# Patient Record
Sex: Female | Born: 1975 | Hispanic: Yes | Marital: Single | Smoking: Never smoker
Health system: Southern US, Community
[De-identification: ages and names within clinical notes are randomized; demographics above are authoritative.]

## PROBLEM LIST (undated history)

## (undated) ENCOUNTER — Inpatient Hospital Stay (HOSPITAL_COMMUNITY): Payer: Self-pay

## (undated) DIAGNOSIS — F1020 Alcohol dependence, uncomplicated: Secondary | ICD-10-CM

## (undated) DIAGNOSIS — F1010 Alcohol abuse, uncomplicated: Secondary | ICD-10-CM

## (undated) DIAGNOSIS — IMO0001 Reserved for inherently not codable concepts without codable children: Secondary | ICD-10-CM

## (undated) DIAGNOSIS — F32A Depression, unspecified: Secondary | ICD-10-CM

## (undated) DIAGNOSIS — F329 Major depressive disorder, single episode, unspecified: Secondary | ICD-10-CM

## (undated) HISTORY — PX: NO PAST SURGERIES: SHX2092

---

## 2000-02-21 ENCOUNTER — Emergency Department (HOSPITAL_COMMUNITY): Admission: EM | Admit: 2000-02-21 | Discharge: 2000-02-21 | Payer: Self-pay | Admitting: Emergency Medicine

## 2000-03-04 ENCOUNTER — Emergency Department (HOSPITAL_COMMUNITY): Admission: EM | Admit: 2000-03-04 | Discharge: 2000-03-04 | Payer: Self-pay | Admitting: Emergency Medicine

## 2000-10-10 ENCOUNTER — Inpatient Hospital Stay (HOSPITAL_COMMUNITY): Admission: AD | Admit: 2000-10-10 | Discharge: 2000-10-12 | Payer: Self-pay | Admitting: Obstetrics & Gynecology

## 2004-09-10 ENCOUNTER — Inpatient Hospital Stay (HOSPITAL_COMMUNITY): Admission: AD | Admit: 2004-09-10 | Discharge: 2004-09-12 | Payer: Self-pay | Admitting: Obstetrics and Gynecology

## 2006-11-20 ENCOUNTER — Inpatient Hospital Stay (HOSPITAL_COMMUNITY): Admission: RE | Admit: 2006-11-20 | Discharge: 2006-11-22 | Payer: Self-pay | Admitting: Obstetrics

## 2009-01-25 ENCOUNTER — Inpatient Hospital Stay (HOSPITAL_COMMUNITY): Admission: AD | Admit: 2009-01-25 | Discharge: 2009-01-27 | Payer: Self-pay | Admitting: Obstetrics

## 2009-01-25 ENCOUNTER — Encounter (INDEPENDENT_AMBULATORY_CARE_PROVIDER_SITE_OTHER): Payer: Self-pay | Admitting: Obstetrics

## 2010-09-16 LAB — CBC
MCHC: 34.6 g/dL (ref 30.0–36.0)
Platelets: 176 10*3/uL (ref 150–400)
Platelets: 198 10*3/uL (ref 150–400)
RDW: 14.5 % (ref 11.5–15.5)
WBC: 11.6 10*3/uL — ABNORMAL HIGH (ref 4.0–10.5)

## 2010-09-16 LAB — RPR: RPR Ser Ql: NONREACTIVE

## 2011-03-29 LAB — CBC
HCT: 34.9 — ABNORMAL LOW
Hemoglobin: 11 — ABNORMAL LOW
Hemoglobin: 11.9 — ABNORMAL LOW
MCHC: 33.6
MCV: 90
Platelets: 209
RBC: 3.62 — ABNORMAL LOW
WBC: 10.6 — ABNORMAL HIGH
WBC: 8.1

## 2013-04-17 ENCOUNTER — Encounter (HOSPITAL_COMMUNITY): Payer: Self-pay | Admitting: Emergency Medicine

## 2013-04-17 ENCOUNTER — Inpatient Hospital Stay (HOSPITAL_COMMUNITY)
Admission: AD | Admit: 2013-04-17 | Discharge: 2013-04-20 | DRG: 781 | Disposition: A | Discharge status: Home / Self Care | Payer: Self-pay | Source: Ambulatory Visit | Attending: Obstetrics | Admitting: Obstetrics

## 2013-04-17 DIAGNOSIS — F1010 Alcohol abuse, uncomplicated: Secondary | ICD-10-CM | POA: Diagnosis present

## 2013-04-17 DIAGNOSIS — F4323 Adjustment disorder with mixed anxiety and depressed mood: Secondary | ICD-10-CM | POA: Diagnosis present

## 2013-04-17 DIAGNOSIS — F1020 Alcohol dependence, uncomplicated: Secondary | ICD-10-CM | POA: Diagnosis present

## 2013-04-17 DIAGNOSIS — Z331 Pregnant state, incidental: Secondary | ICD-10-CM

## 2013-04-17 DIAGNOSIS — O99340 Other mental disorders complicating pregnancy, unspecified trimester: Principal | ICD-10-CM | POA: Diagnosis present

## 2013-04-17 HISTORY — DX: Major depressive disorder, single episode, unspecified: F32.9

## 2013-04-17 HISTORY — DX: Depression, unspecified: F32.A

## 2013-04-17 HISTORY — DX: Alcohol abuse, uncomplicated: F10.10

## 2013-04-17 HISTORY — DX: Alcohol dependence, uncomplicated: F10.20

## 2013-04-17 HISTORY — DX: Reserved for inherently not codable concepts without codable children: IMO0001

## 2013-04-17 LAB — BASIC METABOLIC PANEL
BUN: 4 mg/dL — ABNORMAL LOW (ref 6–23)
Calcium: 9.1 mg/dL (ref 8.4–10.5)
Chloride: 99 mEq/L (ref 96–112)
GFR calc Af Amer: >90 mL/min (ref >90)
GFR calc non Af Amer: >90 mL/min (ref >90)
Potassium: 3.4 mEq/L — ABNORMAL LOW (ref 3.5–5.1)
Sodium: 133 mEq/L — ABNORMAL LOW (ref 135–145)

## 2013-04-17 LAB — URINE MICROSCOPIC-ADD ON

## 2013-04-17 LAB — CBC
HCT: 30.3 % — ABNORMAL LOW (ref 36.0–46.0)
Hemoglobin: 10.6 g/dL — ABNORMAL LOW (ref 12.0–15.0)
MCH: 32.3 pg (ref 26.0–34.0)
MCHC: 35 g/dL (ref 30.0–36.0)
MCV: 92.4 fL (ref 78.0–100.0)
Platelets: 213 10*3/uL (ref 150–400)
RBC: 3.28 MIL/uL — ABNORMAL LOW (ref 3.87–5.11)
RDW: 12.7 % (ref 11.5–15.5)
WBC: 8 10*3/uL (ref 4.0–10.5)

## 2013-04-17 LAB — RAPID URINE DRUG SCREEN, HOSP PERFORMED
Amphetamines: NOT DETECTED
Barbiturates: NOT DETECTED
Benzodiazepines: NOT DETECTED
Cocaine: NOT DETECTED
Opiates: NOT DETECTED
Tetrahydrocannabinol: NOT DETECTED

## 2013-04-17 LAB — URINALYSIS, ROUTINE W REFLEX MICROSCOPIC
Bilirubin Urine: NEGATIVE
Glucose, UA: NEGATIVE mg/dL
Hgb urine dipstick: NEGATIVE
Ketones, ur: 40 mg/dL — AB
Nitrite: NEGATIVE
Protein, ur: NEGATIVE mg/dL
Specific Gravity, Urine: 1.008 (ref 1.005–1.030)
Urobilinogen, UA: 0.2 mg/dL (ref 0.0–1.0)
pH: 6.5 (ref 5.0–8.0)

## 2013-04-17 LAB — BASIC METABOLIC PANEL WITH GFR
CO2: 23 meq/L (ref 19–32)
Creatinine, Ser: 0.43 mg/dL — ABNORMAL LOW (ref 0.50–1.10)
Glucose, Bld: 87 mg/dL (ref 70–99)

## 2013-04-17 LAB — ETHANOL: Alcohol, Ethyl (B): <11 mg/dL (ref 0–11)

## 2013-04-17 MED ORDER — ONDANSETRON HCL 4 MG PO TABS: 4.0000 mg | ORAL_TABLET | Freq: Three times a day (TID) | ORAL | Status: DC | PRN

## 2013-04-17 MED ORDER — COMPLETENATE 29-1 MG PO CHEW
1.0000 | CHEWABLE_TABLET | Freq: Every day | ORAL | Status: DC
  Administered 2013-04-18 – 2013-04-20 (×3): 1 via ORAL
  Filled 2013-04-17 (×4): qty 1

## 2013-04-17 MED ORDER — CALCIUM CARBONATE ANTACID 500 MG PO CHEW
1.0000 | CHEWABLE_TABLET | ORAL | Status: DC | PRN
Start: 2013-04-17 — End: 2013-04-20

## 2013-04-17 MED ORDER — DIAZEPAM 5 MG PO TABS
5.0000 mg | ORAL_TABLET | Freq: Four times a day (QID) | ORAL | Status: DC
  Administered 2013-04-18 – 2013-04-20 (×10): 5 mg via ORAL
  Filled 2013-04-17 (×12): qty 1

## 2013-04-17 MED ORDER — ACETAMINOPHEN 325 MG PO TABS: 650.0000 mg | ORAL_TABLET | ORAL | Status: DC | PRN

## 2013-04-17 NOTE — ED Notes (Signed)
Patient reports to ED for ETOH problem, patient reports that she drinks 8-10 beers per day, pt has been drinking since she was 76 or 37 years old. Patient reports recent increase in amount of ETOH per day, possibly due to depression. Pt denies SI, hallucinations, desire to harm self. Pt reports that she sometimes goes 3 days at most without beer, and that she sometimes shakes and feels anxious after 1 day without beer. Patient is currently pregnant for the sixth time and due in January, with recent Hx of abnormal pap smear. OB RN at bedside.

## 2013-04-17 NOTE — BH Assessment (Signed)
BHH Assessment Progress Note Attempted to assess pt @ 1800.  Tele psych machine not working.  Attempted to get machine to work with pt's nurse for 45 min.  Informed EDP Ghim, as well as informed him that [facility] had no beds per Marchelle Folks, and this is the only facility that may take a pregnant pt.  He then asked if this writer could get in touch with psychiatrist on call to discuss possible medication recommendations if pt discharged home, as there may be no residential treatment options for her.  Passed this on to clinicians coming on shift.  See notes in EPIC.  Tele assessment not yet completed.

## 2013-04-17 NOTE — BH Assessment (Signed)
BHH Assessment Progress Note  At 18:30 I spoke to EDP Quita Skye, MD about this pt.  Casimer Lanius, TS, had started a tele-assessment before the equipment failed.  Dr Oletta Lamas is requesting feedback on outcome of Kristen's work.  I called TTS and left message for Kristen.  If it is determined to be in the best interest of the pt I will perform face-to-face assessment.  Doylene Canning, MA  Triage Specialist  04/17/2013 @ 17:01

## 2013-04-17 NOTE — BH Assessment (Addendum)
BHH Assessment Progress Note   Update:  EDP Ghim called back and pt/baby medically cleared.  Tele assessment scheduled for 1700.  Called and spoke with EDP Ghim to get clinicals.  EDP Ghim will call writer back to see the pt for tele assessment if cleared by the [facility] RN.  In the meantime, TTS to research facilities that could possibly take pregnant women for inpatient SA treatment.
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
## 2013-04-17 NOTE — BHH Counselor (Addendum)
Writer spoke with Dr. Nyoka Cowden, on call psy. Dr. Baron Sane is concerned that the mother drinks 8-10 beers daily and requireds medical detox to keep the mother and baby out of distress. Recommendations from Dr. Baron Sane is for the pt to be sent to [facility] for continuous monitoring of the baby and have a psy consult at [facility].  Writer spoke with Dr. Oletta Lamas and informed him of the recommendations from Dr. Baron Sane. Dr. Oletta Lamas stated that he would have to contact OB GYN to see if they would be willing to take the pt. Writer gave Dr. Oletta Lamas the direct number for Dr. Baron Sane.  1915-Writer contacted [facility] to inquire if they had a bed for the pt. Due to no psy staff on the weekend, the pt will not be considered until Monday 04/20/13. Denice Bors, [facility] 04/17/2013 7:06 PM

## 2013-04-17 NOTE — Progress Notes (Signed)
1542 Arrived to evaluate this 37 yo G6P5 with report of ETOH abuse. Patient denies contractions, vaginal bleeding, or LOF and reports frequent fetal movement.  Patient can not clearly state EDC and prenatal record is not scanned in to [facility] at this time.  Patient sees Dr. Francoise Ceo for prenatal care.  Dr. Elsie Stain office is currently closed.  1620 Dr. Tamela Oddi, on call for Dr. Elsie Stain patients, was consulted regarding this patient.  She states she is unable to access the patient's prenatal records.  Notified of FHR and UC monitoring.  Patient OB cleared by Dr. Tamela Oddi.  She states unless the patient is in danger of severe alcohol withdrawal symptoms, EFM does not need to be continued.1630  Dr. Oletta Lamas notified of Dr. Marcia Brash recommendation.

## 2013-04-18 ENCOUNTER — Encounter (HOSPITAL_COMMUNITY): Payer: Self-pay | Admitting: *Deleted

## 2013-04-18 ENCOUNTER — Observation Stay (HOSPITAL_COMMUNITY): Payer: Self-pay

## 2013-04-18 DIAGNOSIS — O99340 Other mental disorders complicating pregnancy, unspecified trimester: Principal | ICD-10-CM

## 2013-04-18 DIAGNOSIS — F1010 Alcohol abuse, uncomplicated: Secondary | ICD-10-CM | POA: Diagnosis present

## 2013-04-18 DIAGNOSIS — F4323 Adjustment disorder with mixed anxiety and depressed mood: Secondary | ICD-10-CM | POA: Diagnosis present

## 2013-04-18 LAB — COMPREHENSIVE METABOLIC PANEL
BUN: 8 mg/dL (ref 6–23)
CO2: 25 mEq/L (ref 19–32)
Chloride: 101 mEq/L (ref 96–112)
Creatinine, Ser: 0.5 mg/dL (ref 0.50–1.10)
GFR calc Af Amer: >90 mL/min (ref >90)
GFR calc non Af Amer: >90 mL/min (ref >90)
Glucose, Bld: 91 mg/dL (ref 70–99)
Total Bilirubin: 0.3 mg/dL (ref 0.3–1.2)

## 2013-04-18 LAB — CBC
HCT: 28.3 % — ABNORMAL LOW (ref 36.0–46.0)
Hemoglobin: 10.1 g/dL — ABNORMAL LOW (ref 12.0–15.0)
MCH: 32 pg (ref 26.0–34.0)
MCHC: 35 g/dL (ref 30.0–36.0)
MCHC: 34.6 g/dL (ref 30.0–36.0)
MCV: 91.9 fL (ref 78.0–100.0)
MCV: 92.4 fL (ref 78.0–100.0)
RDW: 13 % (ref 11.5–15.5)
RDW: 12.9 % (ref 11.5–15.5)
WBC: 6.4 10*3/uL (ref 4.0–10.5)

## 2013-04-18 LAB — DIFFERENTIAL
Basophils Absolute: 0 10*3/uL (ref 0.0–0.1)
Basophils Relative: 0 % (ref 0–1)
Eosinophils Absolute: 0.1 10*3/uL (ref 0.0–0.7)
Eosinophils Relative: 1 % (ref 0–5)
Lymphs Abs: 2.1 10*3/uL (ref 0.7–4.0)
Monocytes Absolute: 0.6 10*3/uL (ref 0.1–1.0)
Neutro Abs: 3.7 10*3/uL (ref 1.7–7.7)

## 2013-04-18 LAB — HEPATITIS B SURFACE ANTIGEN: Hepatitis B Surface Ag: NEGATIVE

## 2013-04-18 LAB — ABO/RH: ABO/RH(D): O POS

## 2013-04-18 LAB — RPR: RPR Ser Ql: NONREACTIVE

## 2013-04-18 MED ORDER — INFLUENZA VAC SPLIT QUAD 0.5 ML IM SUSP
0.5000 mL | INTRAMUSCULAR | Status: AC
Start: 2013-04-19 — End: 2013-04-19
  Administered 2013-04-19: 0.5 mL via INTRAMUSCULAR
  Filled 2013-04-18: qty 0.5

## 2013-04-18 MED ORDER — DOCUSATE SODIUM 100 MG PO CAPS
100.0000 mg | ORAL_CAPSULE | Freq: Every day | ORAL | Status: DC
  Administered 2013-04-18 – 2013-04-20 (×3): 100 mg via ORAL
  Filled 2013-04-18 (×3): qty 1

## 2013-04-18 MED ORDER — SODIUM CHLORIDE 0.9 % IV SOLN: 250.0000 mL | INTRAVENOUS | Status: DC | PRN

## 2013-04-18 MED ORDER — SODIUM CHLORIDE 0.9 % IJ SOLN: 3.0000 mL | INTRAMUSCULAR | Status: DC | PRN

## 2013-04-18 MED ORDER — SODIUM CHLORIDE 0.9 % IJ SOLN
3.0000 mL | Freq: Two times a day (BID) | INTRAMUSCULAR | Status: DC
  Administered 2013-04-18 – 2013-04-19 (×3): 3 mL via INTRAVENOUS

## 2013-04-18 MED ORDER — ZOLPIDEM TARTRATE 5 MG PO TABS: 5.0000 mg | ORAL_TABLET | Freq: Every evening | ORAL | Status: DC | PRN

## 2013-04-18 NOTE — H&P (Signed)
Diane Rodriguez is a 37 y.o. female presenting for possible alcohol detoxification. Maternal Medical History:  Reason for admission: She presented to the [facility] with a h/o chronic alcoholism.  The patient is requesting assistance with detoxification.  Prenatal complications: Chronic alcoholism  Prenatal Complications - Diabetes: none.    OB History   Grav Para Term Preterm Abortions TAB SAB Ect Mult Living   6 5        5      Past Medical History  Diagnosis Date  . Alcohol abuse   . Depression   . Alcoholism /alcohol abuse     since age [age]yo   Past Surgical History  Procedure Laterality Date  . No past surgeries     Family History: family history includes Alcohol abuse in her brother; Heart disease in her father. Social History:  reports that she has never smoked. She does not have any smokeless tobacco history on file. She reports that she drinks about 36.0 ounces of alcohol per week. She reports that she does not use illicit drugs.     Review of Systems  Constitutional: Negative for fever.  Eyes: Negative for blurred vision.  Respiratory: Negative for shortness of breath.   Gastrointestinal: Negative for vomiting.  Skin: Negative for rash.  Neurological: Negative for headaches.      Blood pressure 91/47, pulse 72, temperature 97.9 F (36.6 C), temperature source Oral, resp. rate 18, height 5\' 1"  (1.549 m), weight 59.875 kg (132 lb), last menstrual period 10/10/2012, SpO2 100.00%. Maternal Exam:  Abdomen: Patient reports no abdominal tenderness. Cervix: not evaluated.   Fetal Exam Fetal Monitor Review: Mode: hand-held doppler probe.    Fetal State Assessment: Category I - tracings are normal.     Physical Exam  Constitutional: She appears well-developed.  HENT:  Head: Normocephalic.  Neck: Neck supple. No thyromegaly present.  Cardiovascular: Normal rate and regular rhythm.   Respiratory: Breath sounds normal.  GI: Soft. Bowel sounds are normal.  Skin:  No rash noted.    Prenatal labs: ABO, Rh: --/--/O POS (11/07 2305) Antibody: NEG (11/07 2305)  Results for orders placed during the hospital encounter of 04/17/13 (from the past 24 hour(s))  CBC     Status: Abnormal   Collection Time    04/17/13  4:15 PM      Result Value Range   WBC 8.0  4.0 - 10.5 K/uL   RBC 3.28 (*) 3.87 - 5.11 MIL/uL   Hemoglobin 10.6 (*) 12.0 - 15.0 g/dL   HCT 40.9 (*) 81.1 - 91.4 %   MCV 92.4  78.0 - 100.0 fL   MCH 32.3  26.0 - 34.0 pg   MCHC 35.0  30.0 - 36.0 g/dL   RDW 78.2  95.6 - 21.3 %   Platelets 213  150 - 400 K/uL  BASIC METABOLIC PANEL     Status: Abnormal   Collection Time    04/17/13  4:15 PM      Result Value Range   Sodium 133 (*) 135 - 145 mEq/L   Potassium 3.4 (*) 3.5 - 5.1 mEq/L   Chloride 99  96 - 112 mEq/L   CO2 23  19 - 32 mEq/L   Glucose, Bld 87  70 - 99 mg/dL   BUN 4 (*) 6 - 23 mg/dL   Creatinine, Ser 0.86 (*) 0.50 - 1.10 mg/dL   Calcium 9.1  8.4 - 57.8 mg/dL   GFR calc non Af Amer >90  >90 mL/min   GFR calc  Af Amer >90  >90 mL/min  ETHANOL     Status: None   Collection Time    04/17/13  4:15 PM      Result Value Range   Alcohol, Ethyl (B) <11  0 - 11 mg/dL  URINALYSIS, ROUTINE W REFLEX MICROSCOPIC     Status: Abnormal   Collection Time    04/17/13  5:28 PM      Result Value Range   Color, Urine YELLOW  YELLOW   APPearance CLEAR  CLEAR   Specific Gravity, Urine 1.008  1.005 - 1.030   pH 6.5  5.0 - 8.0   Glucose, UA NEGATIVE  NEGATIVE mg/dL   Hgb urine dipstick NEGATIVE  NEGATIVE   Bilirubin Urine NEGATIVE  NEGATIVE   Ketones, ur 40 (*) NEGATIVE mg/dL   Protein, ur NEGATIVE  NEGATIVE mg/dL   Urobilinogen, UA 0.2  0.0 - 1.0 mg/dL   Nitrite NEGATIVE  NEGATIVE   Leukocytes, UA TRACE (*) NEGATIVE  URINE RAPID DRUG SCREEN (HOSP PERFORMED)     Status: None   Collection Time    04/17/13  5:28 PM      Result Value Range   Opiates NONE DETECTED  NONE DETECTED   Cocaine NONE DETECTED  NONE DETECTED   Benzodiazepines NONE  DETECTED  NONE DETECTED   Amphetamines NONE DETECTED  NONE DETECTED   Tetrahydrocannabinol NONE DETECTED  NONE DETECTED   Barbiturates NONE DETECTED  NONE DETECTED  URINE MICROSCOPIC-ADD ON     Status: None   Collection Time    04/17/13  5:28 PM      Result Value Range   Squamous Epithelial / LPF RARE  RARE   WBC, UA 0-2  <3 WBC/hpf  TYPE AND SCREEN     Status: None   Collection Time    04/17/13 11:05 PM      Result Value Range   ABO/RH(D) O POS     Antibody Screen NEG     Sample Expiration 04/20/2013    CBC     Status: Abnormal   Collection Time    04/18/13  4:50 AM      Result Value Range   WBC 6.4  4.0 - 10.5 K/uL   RBC 3.08 (*) 3.87 - 5.11 MIL/uL   Hemoglobin 9.9 (*) 12.0 - 15.0 g/dL   HCT 16.1 (*) 09.6 - 04.5 %   MCV 91.9  78.0 - 100.0 fL   MCH 32.1  26.0 - 34.0 pg   MCHC 35.0  30.0 - 36.0 g/dL   RDW 40.9  81.1 - 91.4 %   Platelets 186  150 - 400 K/uL  DIFFERENTIAL     Status: None   Collection Time    04/18/13  4:50 AM      Result Value Range   Neutrophils Relative % 57  43 - 77 %   Neutro Abs 3.7  1.7 - 7.7 K/uL   Lymphocytes Relative 33  12 - 46 %   Lymphs Abs 2.1  0.7 - 4.0 K/uL   Monocytes Relative 10  3 - 12 %   Monocytes Absolute 0.6  0.1 - 1.0 K/uL   Eosinophils Relative 1  0 - 5 %   Eosinophils Absolute 0.1  0.0 - 0.7 K/uL   Basophils Relative 0  0 - 1 %   Basophils Absolute 0.0  0.0 - 0.1 K/uL  RAPID HIV SCREEN [facility])     Status: None   Collection Time    04/18/13  4:50 AM      Result Value Range   SUDS Rapid HIV Screen NON REACTIVE  NON REACTIVE  COMPREHENSIVE METABOLIC PANEL     Status: Abnormal   Collection Time    04/18/13  4:50 AM      Result Value Range   Sodium 135  135 - 145 mEq/L   Potassium 3.6  3.5 - 5.1 mEq/L   Chloride 101  96 - 112 mEq/L   CO2 25  19 - 32 mEq/L   Glucose, Bld 91  70 - 99 mg/dL   BUN 8  6 - 23 mg/dL   Creatinine, Ser 5.78  0.50 - 1.10 mg/dL   Calcium 8.7  8.4 - 46.9 mg/dL   Total Protein 5.9 (*) 6.0 - 8.3 g/dL    Albumin 2.8 (*) 3.5 - 5.2 g/dL   AST 18  0 - 37 U/L   ALT 12  0 - 35 U/L   Alkaline Phosphatase 49  39 - 117 U/L   Total Bilirubin 0.3  0.3 - 1.2 mg/dL   GFR calc non Af Amer >90  >90 mL/min   GFR calc Af Amer >90  >90 mL/min     Assessment/Plan: IUP @ 25 wks w/chronic alcoholism requesting assistance with detoxification  Admit Monitor for signs/symptoms of withdrawal/CIWA protocols Psychiatric consultation for management of detoxification   JACKSON-MOORE,Mekenzie Modeste A 04/18/2013, 9:39 AM

## 2013-04-18 NOTE — Consult Note (Signed)
[facility] Face-to-Face Psychiatry Consult   Reason for Consult:  Alcohol use. detox Referring Physician:  Dr Cathie Hoops is an 37 y.o. female.  Assessment: AXIS I:  Adjustment Disorder with Mixed Emotional Features. Alcohol use disorder, mild  AXIS II:  Deferred AXIS III:   Past Medical History  Diagnosis Date  . Alcohol abuse   . Depression   . Alcoholism /alcohol abuse     since age [age]yo   AXIS IV:  economic problems, occupational problems and other psychosocial or environmental problems AXIS V:  51-60 moderate symptoms  Plan:  No evidence of imminent risk to self or others at present.   Patient does not meet criteria for psychiatric inpatient admission. Alcohol detox already started with valium. Recommend lowering the dose to tid 5mg  tomorrow and 5mg  bid for the next 2 days. Then valium 5mg  qd 5 days. Consider thiamine 100mg  po qd. No acute withdrawals at present.   Subjective:   Diane Rodriguez is a 37 y.o. female patient admitted with drinking alcohol upto 3 beers a day for last few weeks. She is pregnant [redacted] weeks.Marland Kitchen  HPI:  [redacted] weeks pregnant. Married has 6 kids. Says she has been feeling somewhat low during pregnancy didn't know she was pregnant and has been drinking episodically. Continued to drink that way despite later on finding she is pregnant. Has been drinking upto 3 to 4 12 ounces beer a day. Says husband works. Eldest kid is 39 yrs youngest is 4 years. She feels that financially they are not doing well and that keeps her down.   No other use of drugs.  No prior psych history. No prior detox . Currently not in any acute distress. No shakes or tremors.  HPI Elements:   Location:  hospital. Quality:  moderate.  Past Psychiatric History: Past Medical History  Diagnosis Date  . Alcohol abuse   . Depression   . Alcoholism /alcohol abuse     since age [age]yo    reports that she has never smoked. She does not have any smokeless tobacco history on file. She  reports that she drinks about 36.0 ounces of alcohol per week. She reports that she does not use illicit drugs. Family History  Problem Relation Age of Onset  . Heart disease Father   . Alcohol abuse Brother      Living Arrangements: Spouse/significant other   Abuse/Neglect [facility]) Physical Abuse: Denies Verbal Abuse: Denies Sexual Abuse: Denies Allergies:  No Known Allergies  ACT Assessment Complete:  Yes:    Educational Status    Risk to Self: Risk to self Is patient at risk for suicide?: No Substance abuse history and/or treatment for substance abuse?: No  Risk to Others:    Abuse: Abuse/Neglect Assessment (Assessment to be complete while patient is alone) Physical Abuse: Denies Verbal Abuse: Denies Sexual Abuse: Denies Exploitation of patient/patient's resources: Denies Self-Neglect: Denies  Prior Inpatient Therapy:    Prior Outpatient Therapy:    Additional Information:                    Objective: Blood pressure 95/50, pulse 92, temperature 98 F (36.7 C), temperature source Oral, resp. rate 18, height 5\' 1"  (1.549 m), weight 59.875 kg (132 lb), last menstrual period 10/10/2012, SpO2 98.00%.Body mass index is 24.95 kg/(m^2). Results for orders placed during the hospital encounter of 04/17/13 (from the past 72 hour(s))  CBC     Status: Abnormal   Collection Time    04/17/13  4:15 PM      Result Value Range   WBC 8.0  4.0 - 10.5 K/uL   RBC 3.28 (*) 3.87 - 5.11 MIL/uL   Hemoglobin 10.6 (*) 12.0 - 15.0 g/dL   HCT 78.4 (*) 69.6 - 29.5 %   MCV 92.4  78.0 - 100.0 fL   MCH 32.3  26.0 - 34.0 pg   MCHC 35.0  30.0 - 36.0 g/dL   RDW 28.4  13.2 - 44.0 %   Platelets 213  150 - 400 K/uL  BASIC METABOLIC PANEL     Status: Abnormal   Collection Time    04/17/13  4:15 PM      Result Value Range   Sodium 133 (*) 135 - 145 mEq/L   Potassium 3.4 (*) 3.5 - 5.1 mEq/L   Chloride 99  96 - 112 mEq/L   CO2 23  19 - 32 mEq/L   Glucose, Bld 87  70 - 99 mg/dL   BUN 4  (*) 6 - 23 mg/dL   Creatinine, Ser 1.02 (*) 0.50 - 1.10 mg/dL   Calcium 9.1  8.4 - 72.5 mg/dL   GFR calc non Af Amer >90  >90 mL/min   GFR calc Af Amer >90  >90 mL/min   Comment: (NOTE)     The eGFR has been calculated using the CKD EPI equation.     This calculation has not been validated in all clinical situations.     eGFR's persistently <90 mL/min signify possible Chronic Kidney     Disease.  ETHANOL     Status: None   Collection Time    04/17/13  4:15 PM      Result Value Range   Alcohol, Ethyl (B) <11  0 - 11 mg/dL   Comment:            LOWEST DETECTABLE LIMIT FOR     SERUM ALCOHOL IS 11 mg/dL     FOR MEDICAL PURPOSES ONLY  URINALYSIS, ROUTINE W REFLEX MICROSCOPIC     Status: Abnormal   Collection Time    04/17/13  5:28 PM      Result Value Range   Color, Urine YELLOW  YELLOW   APPearance CLEAR  CLEAR   Specific Gravity, Urine 1.008  1.005 - 1.030   pH 6.5  5.0 - 8.0   Glucose, UA NEGATIVE  NEGATIVE mg/dL   Hgb urine dipstick NEGATIVE  NEGATIVE   Bilirubin Urine NEGATIVE  NEGATIVE   Ketones, ur 40 (*) NEGATIVE mg/dL   Protein, ur NEGATIVE  NEGATIVE mg/dL   Urobilinogen, UA 0.2  0.0 - 1.0 mg/dL   Nitrite NEGATIVE  NEGATIVE   Leukocytes, UA TRACE (*) NEGATIVE  URINE RAPID DRUG SCREEN (HOSP PERFORMED)     Status: None   Collection Time    04/17/13  5:28 PM      Result Value Range   Opiates NONE DETECTED  NONE DETECTED   Cocaine NONE DETECTED  NONE DETECTED   Benzodiazepines NONE DETECTED  NONE DETECTED   Amphetamines NONE DETECTED  NONE DETECTED   Tetrahydrocannabinol NONE DETECTED  NONE DETECTED   Barbiturates NONE DETECTED  NONE DETECTED   Comment:            DRUG SCREEN FOR MEDICAL PURPOSES     ONLY.  IF CONFIRMATION IS NEEDED     FOR ANY PURPOSE, NOTIFY LAB     WITHIN 5 DAYS.  LOWEST DETECTABLE LIMITS     FOR URINE DRUG SCREEN     Drug Class       Cutoff (ng/mL)     Amphetamine      1000     Barbiturate      200     Benzodiazepine   200      Tricyclics       300     Opiates          300     Cocaine          300     THC              50  URINE MICROSCOPIC-ADD ON     Status: None   Collection Time    04/17/13  5:28 PM      Result Value Range   Squamous Epithelial / LPF RARE  RARE   WBC, UA 0-2  <3 WBC/hpf  TYPE AND SCREEN     Status: None   Collection Time    04/17/13 11:05 PM      Result Value Range   ABO/RH(D) O POS     Antibody Screen NEG     Sample Expiration 04/20/2013    CBC     Status: Abnormal   Collection Time    04/18/13  4:50 AM      Result Value Range   WBC 6.4  4.0 - 10.5 K/uL   RBC 3.08 (*) 3.87 - 5.11 MIL/uL   Hemoglobin 9.9 (*) 12.0 - 15.0 g/dL   HCT 96.0 (*) 45.4 - 09.8 %   MCV 91.9  78.0 - 100.0 fL   MCH 32.1  26.0 - 34.0 pg   MCHC 35.0  30.0 - 36.0 g/dL   RDW 11.9  14.7 - 82.9 %   Platelets 186  150 - 400 K/uL  DIFFERENTIAL     Status: None   Collection Time    04/18/13  4:50 AM      Result Value Range   Neutrophils Relative % 57  43 - 77 %   Neutro Abs 3.7  1.7 - 7.7 K/uL   Lymphocytes Relative 33  12 - 46 %   Lymphs Abs 2.1  0.7 - 4.0 K/uL   Monocytes Relative 10  3 - 12 %   Monocytes Absolute 0.6  0.1 - 1.0 K/uL   Eosinophils Relative 1  0 - 5 %   Eosinophils Absolute 0.1  0.0 - 0.7 K/uL   Basophils Relative 0  0 - 1 %   Basophils Absolute 0.0  0.0 - 0.1 K/uL  RAPID HIV SCREEN ([facility])     Status: None   Collection Time    04/18/13  4:50 AM      Result Value Range   SUDS Rapid HIV Screen NON REACTIVE  NON REACTIVE  COMPREHENSIVE METABOLIC PANEL     Status: Abnormal   Collection Time    04/18/13  4:50 AM      Result Value Range   Sodium 135  135 - 145 mEq/L   Potassium 3.6  3.5 - 5.1 mEq/L   Chloride 101  96 - 112 mEq/L   CO2 25  19 - 32 mEq/L   Glucose, Bld 91  70 - 99 mg/dL   BUN 8  6 - 23 mg/dL   Creatinine, Ser 5.62  0.50 - 1.10 mg/dL   Calcium 8.7  8.4 - 13.0 mg/dL   Total Protein 5.9 (*) 6.0 -  8.3 g/dL   Albumin 2.8 (*) 3.5 - 5.2 g/dL   AST 18  0 - 37 U/L   ALT 12   0 - 35 U/L   Alkaline Phosphatase 49  39 - 117 U/L   Total Bilirubin 0.3  0.3 - 1.2 mg/dL   GFR calc non Af Amer >90  >90 mL/min   GFR calc Af Amer >90  >90 mL/min   Comment: (NOTE)     The eGFR has been calculated using the CKD EPI equation.     This calculation has not been validated in all clinical situations.     eGFR's persistently <90 mL/min signify possible Chronic Kidney     Disease.  ABO/RH     Status: None   Collection Time    04/18/13  4:50 AM      Result Value Range   ABO/RH(D) O POS    CBC     Status: Abnormal   Collection Time    04/18/13 11:04 AM      Result Value Range   WBC 5.9  4.0 - 10.5 K/uL   RBC 3.16 (*) 3.87 - 5.11 MIL/uL   Hemoglobin 10.1 (*) 12.0 - 15.0 g/dL   HCT 40.9 (*) 81.1 - 91.4 %   MCV 92.4  78.0 - 100.0 fL   MCH 32.0  26.0 - 34.0 pg   MCHC 34.6  30.0 - 36.0 g/dL   RDW 78.2  95.6 - 21.3 %   Platelets 194  150 - 400 K/uL   Labs are reviewed and are pertinent for see medical notes..  Current Facility-Administered Medications  Medication Dose Route Frequency Provider Last Rate Last Dose  . 0.9 %  sodium chloride infusion  250 mL Intravenous PRN Antionette Char, MD      . acetaminophen (TYLENOL) tablet 650 mg  650 mg Oral Q4H PRN Gavin Pound. Ghim, MD      . calcium carbonate (TUMS - dosed in mg elemental calcium) chewable tablet 200 mg of elemental calcium  1 tablet Oral Q4H PRN Antionette Char, MD      . diazepam (VALIUM) tablet 5 mg  5 mg Oral Q6H Antionette Char, MD   5 mg at 04/18/13 0636  . docusate sodium (COLACE) capsule 100 mg  100 mg Oral Daily Antionette Char, MD      . Melene Muller ON 04/19/2013] influenza vac split quadrivalent PF (FLUARIX) injection 0.5 mL  0.5 mL Intramuscular Tomorrow-1000 Antionette Char, MD      . ondansetron [facility]) tablet 4 mg  4 mg Oral Q8H PRN Gavin Pound. Ghim, MD      . prenatal vitamin w/FE, FA (NATACHEW) chewable tablet 1 tablet  1 tablet Oral Q1200 Gavin Pound. Ghim, MD      . sodium chloride 0.9 %  injection 3 mL  3 mL Intravenous Q12H Antionette Char, MD      . sodium chloride 0.9 % injection 3 mL  3 mL Intravenous PRN Antionette Char, MD      . zolpidem (AMBIEN) tablet 5 mg  5 mg Oral QHS PRN Antionette Char, MD        Psychiatric Specialty Exam:     Blood pressure 95/50, pulse 92, temperature 98 F (36.7 C), temperature source Oral, resp. rate 18, height 5\' 1"  (1.549 m), weight 59.875 kg (132 lb), last menstrual period 10/10/2012, SpO2 98.00%.Body mass index is 24.95 kg/(m^2).  General Appearance: Casual  Eye Contact::  Fair  Speech:  Slow  Volume:  Normal  Mood:  Euthymic  Affect:  Constricted  Thought Process:  Linear  Orientation:  Full (Time, Place, and Person)  Thought Content:  No psychosis  Suicidal Thoughts:  No  Homicidal Thoughts:  No  Memory:  Recent;   Good  Judgement:  Poor  Insight:  Lacking  Psychomotor Activity:  Normal  Concentration:  Fair  Recall:  Good  Akathisia:  NA  Handed:  Right  AIMS (if indicated):     Assets:  Desire for Improvement Housing Physical Health Social Support Transportation  Sleep:      Treatment Plan Summary: See above notes on Plan. Have talked with Dr. Christell Constant. At present patient is in no acute distress. Valium for withdrawals is helping can continue. Patient understands she may have put risk to the fetus by drinking. She understands benzo are being used as necessary and understands the risk but is helping detox. Slow taper  With valium as stated in Plan, continue hydration. Please contact social services to make referrals and appointments for Alcohol use and Psychiatry services for outpatient once detoxed. Consider Thiamine and other vitamins as per needed for Alcohol use disorder. At present no tremors or distress noticed. Appreciate the consult.  Byron Tipping 04/18/2013 11:41 AM

## 2013-04-19 DIAGNOSIS — Z331 Pregnant state, incidental: Secondary | ICD-10-CM

## 2013-04-19 LAB — URINALYSIS, ROUTINE W REFLEX MICROSCOPIC
Bilirubin Urine: NEGATIVE
Glucose, UA: NEGATIVE mg/dL
Hgb urine dipstick: NEGATIVE
Ketones, ur: NEGATIVE mg/dL
Leukocytes, UA: NEGATIVE
Protein, ur: NEGATIVE mg/dL
Urobilinogen, UA: 0.2 mg/dL (ref 0.0–1.0)
pH: 7 (ref 5.0–8.0)

## 2013-04-19 MED ORDER — THIAMINE HCL 100 MG/ML IJ SOLN
Freq: Once | INTRAVENOUS | Status: AC
  Administered 2013-04-19: 10:00:00 via INTRAVENOUS
  Filled 2013-04-19: qty 1000

## 2013-04-19 MED ORDER — VITAMIN B-1 100 MG PO TABS
100.0000 mg | ORAL_TABLET | Freq: Every day | ORAL | Status: DC
  Administered 2013-04-20: 10:00:00 100 mg via ORAL
  Filled 2013-04-19 (×2): qty 1

## 2013-04-19 NOTE — Progress Notes (Signed)
Patient ID: Diane Rodriguez, female   DOB: [birth year], 37 y.o.   MRN: [id number] Hospital Day: 3  S: Preterm labor symptoms: none  O: Blood pressure 99/50, pulse 78, temperature 98.4 F (36.9 C), temperature source Oral, resp. rate 18, height 5\' 1"  (1.549 m), weight 59.875 kg (132 lb), last menstrual period 10/10/2012, SpO2 100.00%.   UVO:ZDGUYQIH: 160 bpm Toco: None SVE: deferred  A/P- 37 y.o. admitted with:  Present on Admission:  . Alcohol abuse, unspecified . Adjustment disorder with mixed anxiety and depressed mood Undergoing detoxifications--no symptoms/VSS Pregnancy Complications: see above  Preterm labor management: none Dating:  [redacted]w[redacted]d FWB:  FHT reassuring Banana bag-->Thiamine orally, daily

## 2013-04-19 NOTE — Progress Notes (Signed)
Pt taken off the monitor, pt moving to a more comfortable position on her right side

## 2013-04-20 NOTE — Progress Notes (Signed)
Pt ride here--pt d/c home--discharge instructions reviewed, prescriptions given and reviewed--instructions given to call doctor or to come to hospital for feelings of helplessness or thoughts of hurting self/others--pt states understanding and verbalizes importance of calling MD when symptoms arise

## 2013-04-20 NOTE — Discharge Summary (Signed)
  Patient is a 37 year old gravida 6 para 5 at 50 weeks and 3 days and was admitted because she stated she thinks she was drinking too much alcohol 8 beers a day and him she was seen by the psychiatrist who put her on Valium 5 mg 3 times a day and twice a day then once a day patient is asymptomatic and feels fine since you did discharge today on Valium 5 mg twice a day and to see me in one week and we'll refer her to the psychiatrist as an outpatient

## 2013-04-20 NOTE — Progress Notes (Signed)
Post discharge review completed. 
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
## 2013-05-28 ENCOUNTER — Encounter (HOSPITAL_COMMUNITY): Payer: Self-pay | Admitting: *Deleted

## 2013-05-28 ENCOUNTER — Inpatient Hospital Stay (HOSPITAL_COMMUNITY)
Admission: AD | Admit: 2013-05-28 | Discharge: 2013-05-28 | Disposition: A | Discharge status: Home / Self Care | Payer: Self-pay | Source: Ambulatory Visit | Attending: Obstetrics | Admitting: Obstetrics

## 2013-05-28 DIAGNOSIS — O99340 Other mental disorders complicating pregnancy, unspecified trimester: Secondary | ICD-10-CM | POA: Insufficient documentation

## 2013-05-28 DIAGNOSIS — F1010 Alcohol abuse, uncomplicated: Secondary | ICD-10-CM

## 2013-05-28 DIAGNOSIS — Z331 Pregnant state, incidental: Secondary | ICD-10-CM

## 2013-05-28 DIAGNOSIS — F1020 Alcohol dependence, uncomplicated: Secondary | ICD-10-CM | POA: Insufficient documentation

## 2013-05-28 NOTE — MAU Note (Signed)
PT SAYS  SHE   WENT TO [facility] 3 WEEKS AGO THEN WAS BROUGHT HERE  AND ADMITED X2 DAYS.  SAYS TODAY SHE WENT TO  DSS AND THEY TOLD HER TO COME HERE TONIGHT  FOR DETOX-  DSS WAS DONNA WILDER.  PT GETS PNC WITH DR MARSHALL- LAST  SEEN  2 WEEKS AGO AND NEXT APPOINTMENT  IS MON.    DENIES  UC, [facility]OM, BLEEDING.     NOW WITH INTERPRETER- MADAY-  [place], CNM EXPLAINED PLAN TO PT.    PT HAS A FRIEND WITH HER- ABBY  CARDONA- SPEAKS ENGLISH.

## 2013-05-28 NOTE — MAU Provider Note (Signed)
Chief Complaint:  No chief complaint on file.   First Provider Initiated Contact with Patient 05/28/13 2118     HPI: Diane Rodriguez is a 37 y.o. G6P5 at [redacted]w[redacted]d who presents to maternity admissions reporting that she was instructed by DSS to go to [facility] for outpatient detox from alcoholism. Has DSS form with her. Does not specify which hospital or time. Pt states she has not had any alcohol since 05/23/2013--"3 small beers." States she is not in crisis. Friend/Interpreter present. Is pt's only transportation. Pt has multiple psychosocial stressors. Was hospitalized 1 month ago for alcoholism. Valium taper per psych consult.    Denies contractions, leakage of fluid or vaginal bleeding. Good fetal movement.   Pregnancy Course: Complicated by chronic alcoholism.   Past Medical History: Past Medical History  Diagnosis Date  . Alcohol abuse   . Depression   . Alcoholism /alcohol abuse     since age [age]yo    Past obstetric history: OB History  Gravida Para Term Preterm AB SAB TAB Ectopic Multiple Living  6 5        5     # Outcome Date GA Lbr Len/2nd Weight Sex Delivery Anes PTL Lv  6 CUR           5 PAR 01/25/09     SVD     4 PAR 11/20/06     SVD     3 PAR 09/10/04     SVD     2 PAR 10/10/00     SVD     1 PAR 02/04/95     SVD   Y      Past Surgical History: Past Surgical History  Procedure Laterality Date  . No past surgeries       Family History: Family History  Problem Relation Age of Onset  . Heart disease Father   . Alcohol abuse Brother     Social History: History  Substance Use Topics  . Smoking status: Never Smoker   . Smokeless tobacco: Not on file  . Alcohol Use: 36.0 oz/week    60 Cans of beer per week     Comment: Patient states she has been drinking 8-10 beers daily for duration of pregnancy.     Allergies: No Known Allergies  Meds:  Prescriptions prior to admission  Medication Sig Dispense Refill  . Prenatal Vit-Fe Fumarate-FA  (MULTIVITAMIN-PRENATAL) 27-0.8 MG TABS tablet Take 1 tablet by mouth daily at 12 noon.        ROS: Pertinent findings in history of present illness.  Physical Exam  Blood pressure 107/68, pulse 93, temperature 98.7 F (37.1 C), temperature source Oral, resp. rate 20, height 5' (1.524 m), weight 64.524 kg (142 lb 4 oz), last menstrual period 10/10/2012. GENERAL: Well-developed, well-nourished female in no acute distress. Not intoxicated-appearing. HEENT: normocephalic HEART: normal rate RESP: normal effort NEURO: alert and oriented SPECULUM EXAM: Deferred FHT: 137 per doppler   Labs: Results for orders placed during the hospital encounter of 05/28/13 (from the past 24 hour(s))  ETHANOL     Status: None   Collection Time    05/28/13  9:34 PM      Result Value Range   Alcohol, Ethyl (B) <11  0 - 11 mg/dL   Imaging:  No results found.  [facility] Course:   Assessment: 1. Pregnant state, incidental   2. Chronic alcohol dependence, continuous     Plan: Discharge home per consult w/ Dr. Gaynell Face. Pt not candidate for admission.Will call DSS  SW and [facility] SW and formulate plan. Pt very nervous about being accused of not following through with DSS recommendations. Requesting documentation that she came to hospital asking for detox. Letter given. (See below) ROI signed.  Preterm labor precautions and fetal kick counts     Follow-up Information   Call MARSHALL,BERNARD A, MD. (office in the morning to check status of social work consultation)    Specialty:  Obstetrics and Gynecology   Contact information:   [address] [place] [address] [phone]       Follow up with [facility] [place] MATERN[facility] ADMISSIONS. (As needed if symptoms worsen)    Contact information:   [address] [id number] [place] [address] [phone]       Medication List         multivitamin-prenatal 27-0.8 MG Tabs tablet  Take 1 tablet by mouth daily  at 12 noon.        [place], CNM 05/28/2013 9:37 PM  Diane Rodriguez presented to Maternity Admissions at [facility] 05/28/2013 requesting detox treatment to be in compliance with DSS recommendations. She was not intoxicated or in crisis upon arrival to [facility]. Her obstetrician Dr. Gaynell Face was consulted. Because [facility] does not provide detox treatment and the patient was not in crisis requiring admission for stabilization of the patient or the baby Dr. Gaynell Face did not admit the patient. He plans to call DSS social worker Diane Rodriguez and hospital social worker Diane Rodriguez in the morning to explore treatment options and help the patient comply with  DSS recommendations.

## 2013-08-04 ENCOUNTER — Inpatient Hospital Stay (HOSPITAL_COMMUNITY)
Admission: AD | Admit: 2013-08-04 | Discharge: 2013-08-05 | DRG: 775 | Disposition: A | Discharge status: Home / Self Care | Payer: Medicaid Other | Source: Ambulatory Visit | Attending: Obstetrics | Admitting: Obstetrics

## 2013-08-04 ENCOUNTER — Encounter (HOSPITAL_COMMUNITY): Payer: Self-pay | Admitting: *Deleted

## 2013-08-04 DIAGNOSIS — O479 False labor, unspecified: Secondary | ICD-10-CM

## 2013-08-04 DIAGNOSIS — O99344 Other mental disorders complicating childbirth: Secondary | ICD-10-CM | POA: Diagnosis present

## 2013-08-04 DIAGNOSIS — F3289 Other specified depressive episodes: Secondary | ICD-10-CM | POA: Diagnosis present

## 2013-08-04 DIAGNOSIS — Z331 Pregnant state, incidental: Secondary | ICD-10-CM

## 2013-08-04 DIAGNOSIS — O09529 Supervision of elderly multigravida, unspecified trimester: Principal | ICD-10-CM | POA: Diagnosis present

## 2013-08-04 DIAGNOSIS — F329 Major depressive disorder, single episode, unspecified: Secondary | ICD-10-CM

## 2013-08-04 DIAGNOSIS — IMO0001 Reserved for inherently not codable concepts without codable children: Secondary | ICD-10-CM

## 2013-08-04 DIAGNOSIS — F1010 Alcohol abuse, uncomplicated: Secondary | ICD-10-CM | POA: Diagnosis present

## 2013-08-04 LAB — CBC
HCT: 31.9 % — ABNORMAL LOW (ref 36.0–46.0)
HEMOGLOBIN: 10.8 g/dL — AB (ref 12.0–15.0)
MCH: 29.8 pg (ref 26.0–34.0)
MCHC: 33.9 g/dL (ref 30.0–36.0)
MCV: 88.1 fL (ref 78.0–100.0)
PLATELETS: 231 10*3/uL (ref 150–400)
RBC: 3.62 MIL/uL — ABNORMAL LOW (ref 3.87–5.11)
RDW: 13.6 % (ref 11.5–15.5)
WBC: 13.6 10*3/uL — ABNORMAL HIGH (ref 4.0–10.5)

## 2013-08-04 LAB — ABO/RH: ABO/RH(D): O POS

## 2013-08-04 LAB — RAPID URINE DRUG SCREEN, HOSP PERFORMED
AMPHETAMINES: NOT DETECTED
BENZODIAZEPINES: NOT DETECTED
Barbiturates: NOT DETECTED
Cocaine: NOT DETECTED
OPIATES: NOT DETECTED
Tetrahydrocannabinol: NOT DETECTED

## 2013-08-04 LAB — RPR: RPR: NONREACTIVE

## 2013-08-04 MED ORDER — DIBUCAINE 1 % RE OINT: 1.0000 "application " | TOPICAL_OINTMENT | RECTAL | Status: DC | PRN

## 2013-08-04 MED ORDER — ONDANSETRON HCL 4 MG/2ML IJ SOLN: 4.0000 mg | Freq: Four times a day (QID) | INTRAMUSCULAR | Status: DC | PRN

## 2013-08-04 MED ORDER — PRENATAL MULTIVITAMIN CH
1.0000 | ORAL_TABLET | Freq: Every day | ORAL | Status: DC
  Administered 2013-08-05: 1 via ORAL
  Filled 2013-08-04: qty 1

## 2013-08-04 MED ORDER — ONDANSETRON HCL 4 MG PO TABS
4.0000 mg | ORAL_TABLET | ORAL | Status: DC | PRN
Start: 2013-08-04 — End: 2013-08-05

## 2013-08-04 MED ORDER — OXYCODONE-ACETAMINOPHEN 5-325 MG PO TABS
1.0000 | ORAL_TABLET | ORAL | Status: DC | PRN
  Administered 2013-08-05: 1 via ORAL
  Filled 2013-08-04: qty 1

## 2013-08-04 MED ORDER — WITCH HAZEL-GLYCERIN EX PADS: 1.0000 "application " | MEDICATED_PAD | CUTANEOUS | Status: DC | PRN

## 2013-08-04 MED ORDER — DIPHENHYDRAMINE HCL 25 MG PO CAPS
25.0000 mg | ORAL_CAPSULE | Freq: Four times a day (QID) | ORAL | Status: DC | PRN
Start: 2013-08-04 — End: 2013-08-05

## 2013-08-04 MED ORDER — IBUPROFEN 600 MG PO TABS: 600.0000 mg | ORAL_TABLET | Freq: Four times a day (QID) | ORAL | Status: DC | PRN

## 2013-08-04 MED ORDER — LANOLIN HYDROUS EX OINT: TOPICAL_OINTMENT | CUTANEOUS | Status: DC | PRN

## 2013-08-04 MED ORDER — OXYTOCIN 10 UNIT/ML IJ SOLN
INTRAMUSCULAR | Status: AC
  Administered 2013-08-04: 10 [IU] via INTRAMUSCULAR
  Filled 2013-08-04: qty 2

## 2013-08-04 MED ORDER — OXYCODONE-ACETAMINOPHEN 5-325 MG PO TABS: 1.0000 | ORAL_TABLET | ORAL | Status: DC | PRN

## 2013-08-04 MED ORDER — IBUPROFEN 600 MG PO TABS
600.0000 mg | ORAL_TABLET | Freq: Four times a day (QID) | ORAL | Status: DC
  Administered 2013-08-04 – 2013-08-05 (×3): 600 mg via ORAL
  Filled 2013-08-04 (×3): qty 1

## 2013-08-04 MED ORDER — LIDOCAINE HCL (PF) 1 % IJ SOLN: 30.0000 mL | INTRAMUSCULAR | Status: DC | PRN

## 2013-08-04 MED ORDER — SENNOSIDES-DOCUSATE SODIUM 8.6-50 MG PO TABS
2.0000 | ORAL_TABLET | ORAL | Status: DC
  Administered 2013-08-05: 2 via ORAL
  Filled 2013-08-04: qty 2

## 2013-08-04 MED ORDER — BENZOCAINE-MENTHOL 20-0.5 % EX AERO: 1.0000 "application " | INHALATION_SPRAY | CUTANEOUS | Status: DC | PRN

## 2013-08-04 MED ORDER — SIMETHICONE 80 MG PO CHEW
80.0000 mg | CHEWABLE_TABLET | ORAL | Status: DC | PRN
Start: 2013-08-04 — End: 2013-08-05

## 2013-08-04 MED ORDER — ACETAMINOPHEN 325 MG PO TABS: 650.0000 mg | ORAL_TABLET | ORAL | Status: DC | PRN

## 2013-08-04 MED ORDER — TETANUS-DIPHTH-ACELL PERTUSSIS 5-2.5-18.5 LF-MCG/0.5 IM SUSP: 0.5000 mL | Freq: Once | INTRAMUSCULAR | Status: DC

## 2013-08-04 MED ORDER — ZOLPIDEM TARTRATE 5 MG PO TABS: 5.0000 mg | ORAL_TABLET | Freq: Every evening | ORAL | Status: DC | PRN

## 2013-08-04 MED ORDER — OXYTOCIN 10 UNIT/ML IJ SOLN
10.0000 [IU] | Freq: Once | INTRAMUSCULAR | Status: AC
  Administered 2013-08-04: 10 [IU] via INTRAMUSCULAR

## 2013-08-04 MED ORDER — ONDANSETRON HCL 4 MG/2ML IJ SOLN: 4.0000 mg | INTRAMUSCULAR | Status: DC | PRN

## 2013-08-04 MED ORDER — CITRIC ACID-SODIUM CITRATE 334-500 MG/5ML PO SOLN: 30.0000 mL | ORAL | Status: DC | PRN

## 2013-08-04 NOTE — MAU Provider Note (Signed)
Patient came in at 3764w6d with imminent delivery  See H& P for my complete note.  Aviva SignsMarie L Sade Hollon, CNM

## 2013-08-04 NOTE — H&P (Signed)
Diane SectionSandra Rodriguez is a 38 y.o. female presenting for active labor. Maternal Medical History:  Reason for admission: Contractions.  Nausea.  Contractions: Onset was 1-2 hours ago.   Frequency: regular.   Perceived severity is strong.    Fetal activity: Perceived fetal activity is normal.   Last perceived fetal movement was within the past hour.    Prenatal complications: Bleeding and substance abuse (alcohol).   Prenatal Complications - Diabetes: none.    OB History   Grav Para Term Preterm Abortions TAB SAB Ect Mult Living   6 5        5      Past Medical History  Diagnosis Date  . Alcohol abuse   . Depression   . Alcoholism /alcohol abuse     since age [age]yo   Past Surgical History  Procedure Laterality Date  . No past surgeries     Family History: family history includes Alcohol abuse in her brother; Heart disease in her father. Social History:  reports that she has never smoked. She does not have any smokeless tobacco history on file. She reports that she drinks about 36.0 ounces of alcohol per week. She reports that she does not use illicit drugs.     Review of Systems  Constitutional: Negative for fever.  Gastrointestinal: Positive for abdominal pain. Negative for nausea and vomiting.  Neurological: Negative for dizziness.  Psychiatric/Behavioral: Positive for substance abuse.      Blood pressure 115/69, pulse 74, temperature 97.8 F (36.6 C), temperature source Oral, resp. rate 18, last menstrual period 10/10/2012. Maternal Exam:  Uterine Assessment: Contraction strength is firm.  Contraction frequency is regular.   Abdomen: Fundal height is 36.   Estimated fetal weight is 6.5.   Fetal presentation: vertex  Introitus: Normal vulva. Vagina is positive for vaginal discharge.  Ferning test: not done.  Nitrazine test: not done. Amniotic fluid character: not assessed.  Pelvis: adequate for delivery.   Cervix: Cervix evaluated by digital exam.      Physical Exam  Constitutional: She is oriented to person, place, and time. She appears well-developed and well-nourished. She appears distressed (with advanced labor).  HENT:  Head: Normocephalic.  Cardiovascular: Normal rate.   Respiratory: Effort normal.  GI: Soft. There is no tenderness. There is no rebound and no guarding.  Genitourinary: Uterus normal. Vaginal discharge found.  Completely dilated and +1 station  Musculoskeletal: Normal range of motion.  Neurological: She is alert and oriented to person, place, and time.  Skin: Skin is warm and dry.  Psychiatric: She has a normal mood and affect.    Prenatal labs: ABO, Rh: --/--/O POS (11/08 0450) Antibody: NEG (11/07 2305) Rubella: 13.80 (11/08 0450) RPR: NON REACTIVE (11/08 0450)  HBsAg: NEGATIVE (11/08 0450)  HIV:    GBS:     Assessment/Plan: A:  SIUP at 5128w6d       Active labor, Transition       Imminent Delivery  P:  Admit        Prepare for delivery in MAU  [facility],Kiahna Banghart 08/04/2013, 6:39 PM

## 2013-08-04 NOTE — MAU Note (Signed)
Pt states baby is coming, pt escorted to room.  Female infant delivered @ [phone] en caul.

## 2013-08-04 NOTE — Progress Notes (Signed)
Delivery Note Baby began crowning upon admission to room.    At 6:18 PM a viable and healthy female was delivered via  (Presentation: LOA, en caul).   APGAR:4 / 9; weight .      Nuchal cord x 1 under the caul. Placenta status: spontaneous and grossly intact.  Cord: 3 vessels with the following complications: nuchal x 1.    Anesthesia: none  Episiotomy: none Lacerations: none Suture Repair: none Est. Blood Loss (mL): 100   Mom to postpartum.  Baby to Couplet care / Skin to Skin.  [facility]ILLIAMS,Diane Rodriguez, 6:43 PM

## 2013-08-05 LAB — CBC
HEMATOCRIT: 28.8 % — AB (ref 36.0–46.0)
Hemoglobin: 9.8 g/dL — ABNORMAL LOW (ref 12.0–15.0)
MCH: 30.2 pg (ref 26.0–34.0)
MCHC: 34 g/dL (ref 30.0–36.0)
MCV: 88.9 fL (ref 78.0–100.0)
Platelets: 229 10*3/uL (ref 150–400)
RBC: 3.24 MIL/uL — AB (ref 3.87–5.11)
RDW: 13.7 % (ref 11.5–15.5)
WBC: 11.2 10*3/uL — ABNORMAL HIGH (ref 4.0–10.5)

## 2013-08-05 LAB — ALCOHOL METABOLITE (ETG), URINE: Ethyl Glucuronide (EtG): NEGATIVE ng/mL

## 2013-08-05 NOTE — Discharge Instructions (Signed)
Discharge instructions   You can wash your hair  Shower  Eat what you want  Drink what you want  See me in 6 weeks  Your ankles are going to swell more in the next 2 weeks than when pregnant  No sex for 6 weeks   [place] A, MD 08/05/2013

## 2013-08-05 NOTE — Progress Notes (Signed)
Ur chart review completed.  

## 2013-08-05 NOTE — Progress Notes (Signed)
Patient ID: Diane Rodriguez, female   DOB: 10/08/1975, 38 y.o.   MRN: [id number] Postpartum day one Vital signs normal Fundus firm Lochia moderate Wants her to discharge

## 2013-08-05 NOTE — Discharge Summary (Signed)
Obstetric Discharge Summary Reason for Admission: onset of labor Prenatal Procedures: none Intrapartum Procedures: spontaneous vaginal delivery Postpartum Procedures: none Complications-Operative and Postpartum: none Hemoglobin  Date Value Ref Range Status  08/05/2013 9.8* 12.0 - 15.0 g/dL Final     HCT  Date Value Ref Range Status  08/05/2013 28.8* 36.0 - 46.0 % Final    Physical Exam:  General: alert Lochia: appropriate Uterine Fundus: firm Incision: healing well DVT Evaluation: No evidence of DVT seen on physical exam.  Discharge Diagnoses: Term Pregnancy-delivered  Discharge Information: Date: 08/05/2013 Activity: pelvic rest Diet: routine Medications: Percocet Condition: stable Instructions: refer to practice specific booklet Discharge to: home Follow-up Information   Follow up with Kathreen CosierMARSHALL,BERNARD A, MD.   Specialty:  Obstetrics and Gynecology   Contact information:   [address] [place] [address] [phone]       Newborn Data: Live born female  Birth Weight: 6 lb 14.6 oz (3134 g) APGAR: 4, 9  Home with mother.  MARSHALL,BERNARD A 08/05/2013, 7:01 AM

## 2013-08-05 NOTE — Lactation Note (Signed)
This note was copied from the chart of Diane Rodriguez. Lactation Consultation Note  Patient Name: Diane Rodriguez [id number]'s Date: 08/05/2013 Reason for consult: Initial assessment;Other (Comment) (charting for exclusion)   Maternal Data Formula Feeding for Exclusion: Yes Reason for exclusion: Mother's choice to formula feed on admision  Feeding Feeding Type: Formula  LATCH Score/Interventions                      Lactation Tools Discussed/Used     Consult Status Consult Status: Complete    Lynda RainwaterBryant, Camarion Weier [place] 08/05/2013, 4:29 PM

## 2013-08-06 NOTE — Progress Notes (Signed)
Clinical Social Work Department PSYCHOSOCIAL ASSESSMENT - MATERNAL/CHILD 08/05/2013  Patient:  Diane Rodriguez,Diane Rodriguez  Account Number:  [id number]  Admit Date:  08/04/2013  Childs Name:   Diane Rodriguez    Clinical Social Worker:  Ishi Danser, LCSW   Date/Time:  08/05/2013 10:00 AM  Date Referred:  08/05/2013   Referral source  CN     Referred reason  Substance Abuse   Other referral source:    I:  FAMILY / HOME ENVIRONMENT Child's legal guardian:  PARENT  Guardian - Name Guardian - Age Guardian - Address  Diane Rodriguez 37 [address] [place], [place] [address]  Diane Rodriguez  same   Other household support members/support persons Other support:   MOB has 5 other children, ages 18, 12, 8, 6, and 4.  FOB is the father of all but the 18 year old.  The parents are married.  FOB's sister is their greatest support person.    II  PSYCHOSOCIAL DATA Information Source:  Patient Interview  Financial and Community Resources Employment:   FOB is a truck driver, however, from the conversation wiht MOB today, CSW is unsure if FOB is still working.   Financial resources:   If Medicaid - County:    School / Grade:   Maternity Care Coordinator / Child Services Coordination / Early Interventions:   CC4C  Cultural issues impacting care:   MOB's first language is Spanish.  She speaks English and denies the need for an interpreter, however, there is a considerable language barrier.    III  STRENGTHS Strengths  Home prepared for Child (including basic supplies)  Other - See comment  Supportive family/friends   Strength comment:  There have been improvements in MOB's situation since CSW last met with MOB per MOB.   IV  RISK FACTORS AND CURRENT PROBLEMS Current Problem:  YES   Risk Factor & Current Problem Patient Issue Family Issue Risk Factor / Current Problem Comment  Substance Abuse Y N MOB has a hx of alcohol abuse  DSS Involvement Y N MOB has an open case due  to her substance use.  Mental Illness Y N MOB has a hx of Depression   N N     V  SOCIAL WORK ASSESSMENT  CSW is familiar with this MOB from initially meeting her when she was a patient on [facility] in 04/2013.  CSW met with MOB today to follow up and complete assessment for hx of alcohol abuse now that she has had her baby.  MOB was pleasant and welcoming, as she was the first time CSW met her.  She declined need for a Spanish interpreter.  There were times in the conversation that CSW had a difficult time MOB's train of thought, which CSW feels may have been due to the language barrier.  MOB reports that she is doing well and that she has stopped drinking.  CSW commended her for this and asked her when her last drink was.  CSW recalls that at first meeting with MOB, she admitted to drinking 10 beers at a time on occasion during pregnancy.  Today, MOB states she does not remember when her last drink was, but states, "it's been a little bit of time."  CSW asked her if she is in a substance abuse treatment program.  She said yes.  CSW asked her where she is getting treatment and she reports she does not know.  She replied, "I got to the class."  MOB could not   tell CSW where the class was other than "[place]where off [address]."  She states she goes Mondays and Wednesdays.  CSW asked about MOB's emotional wellbeing and symptoms of depression that she reported in November.  MOB states she is feeling "well" and reports no symptoms at this time.  She states she has not received any mental health counseling.  CSW inquired about her involvement with CPS and she states Ms. Wilder (CPS worker in [place]) is not working with her anymore.  She states someone named Sarah comes to her house sometimes.  CSW asked if her children are still living with her sister-in-law, which CSW learned from Ms. Wilder/CPS worker in November.  MOB's response was convoluted and she began talking about her husband not driving the truck  anymore so he could be home to help take care of the new baby.  CSW assumes that this is because she has been told by CPS that she is not to be alone with her children without FOB of Sister-in-law's supervision.  CSW did not press the issue any further, but informed MOB that CSW would have to let CPS know that her baby had now been born due to her recent (and possible current) involvement with CPS.  She was understanding.  CSW asked if there is anything CSW can do for her.  She replied again that she is doing well and has no needs for CSW at this time.  CSW made report to CPS and spoke with D. Wilder who will be the CPS for new baby.  Ms. Wilder will be coming to speak with MOB today.    VI SOCIAL WORK PLAN Social Work Plan  Child Protective Services Report   Type of pt/family education:   Importance of Substance Abuse and Mental Health treatment  CSW informed MOB that a CPS report would be made now that baby has been born due to substance abuse concerns and current open case on her other children.   If child protective services report - county:  [place] If child protective services report - date:  08/05/2013 Information/referral to community resources comment:   CC4C   Other social work plan:   CSW will monitor baby's MDS results and provide CPS worker with these results.     

## 2013-08-06 NOTE — Progress Notes (Signed)
D. Wilder/CPS worker has met with MOB in the hospital today and has made a Safety Assessment that states FOB will provide supervision any time MOB is with baby at this time.  Baby may discharge to MOB with FOB present when medically ready.  A copy of the Safety Assessment is in baby's paper chart. 

## 2013-12-03 ENCOUNTER — Encounter (HOSPITAL_COMMUNITY): Payer: Self-pay | Admitting: Emergency Medicine

## 2013-12-03 ENCOUNTER — Emergency Department (HOSPITAL_COMMUNITY)
Admission: EM | Admit: 2013-12-03 | Discharge: 2013-12-04 | Disposition: A | Discharge status: Rehabilitation Facility | Payer: Medicaid Other | Attending: Emergency Medicine | Admitting: Emergency Medicine

## 2013-12-03 DIAGNOSIS — Z3202 Encounter for pregnancy test, result negative: Secondary | ICD-10-CM | POA: Insufficient documentation

## 2013-12-03 DIAGNOSIS — F1010 Alcohol abuse, uncomplicated: Secondary | ICD-10-CM | POA: Insufficient documentation

## 2013-12-03 LAB — CBC
HCT: 39.8 % (ref 36.0–46.0)
HEMOGLOBIN: 13.4 g/dL (ref 12.0–15.0)
MCH: 29.7 pg (ref 26.0–34.0)
MCHC: 33.7 g/dL (ref 30.0–36.0)
MCV: 88.2 fL (ref 78.0–100.0)
Platelets: 283 10*3/uL (ref 150–400)
RBC: 4.51 MIL/uL (ref 3.87–5.11)
RDW: 14.1 % (ref 11.5–15.5)
WBC: 5.3 10*3/uL (ref 4.0–10.5)

## 2013-12-03 LAB — ETHANOL
ALCOHOL ETHYL (B): 217 mg/dL — AB (ref 0–11)
Alcohol, Ethyl (B): 329 mg/dL — ABNORMAL HIGH (ref 0–11)

## 2013-12-03 LAB — COMPREHENSIVE METABOLIC PANEL
ALT: 17 U/L (ref 0–35)
AST: 27 U/L (ref 0–37)
Albumin: 4.2 g/dL (ref 3.5–5.2)
Alkaline Phosphatase: 65 U/L (ref 39–117)
BUN: 7 mg/dL (ref 6–23)
CALCIUM: 9.1 mg/dL (ref 8.4–10.5)
CO2: 22 meq/L (ref 19–32)
CREATININE: 0.48 mg/dL — AB (ref 0.50–1.10)
Chloride: 100 mEq/L (ref 96–112)
GFR calc Af Amer: >90 mL/min (ref >90)
GLUCOSE: 126 mg/dL — AB (ref 70–99)
Potassium: 4.2 mEq/L (ref 3.7–5.3)
Sodium: 139 mEq/L (ref 137–147)
Total Bilirubin: 0.3 mg/dL (ref 0.3–1.2)
Total Protein: 8.1 g/dL (ref 6.0–8.3)

## 2013-12-03 LAB — RAPID URINE DRUG SCREEN, HOSP PERFORMED
Amphetamines: NOT DETECTED
Barbiturates: NOT DETECTED
Benzodiazepines: NOT DETECTED
Cocaine: NOT DETECTED
OPIATES: NOT DETECTED
TETRAHYDROCANNABINOL: NOT DETECTED

## 2013-12-03 LAB — PREGNANCY, URINE: Preg Test, Ur: NEGATIVE

## 2013-12-03 LAB — ACETAMINOPHEN LEVEL: Acetaminophen (Tylenol), Serum: <15 ug/mL (ref 10–30)

## 2013-12-03 LAB — SALICYLATE LEVEL

## 2013-12-03 MED ORDER — ZOLPIDEM TARTRATE 5 MG PO TABS: 5.0000 mg | ORAL_TABLET | Freq: Every evening | ORAL | Status: DC | PRN

## 2013-12-03 MED ORDER — FOLIC ACID 1 MG PO TABS
1.0000 mg | ORAL_TABLET | Freq: Every day | ORAL | Status: DC
  Administered 2013-12-03 – 2013-12-04 (×2): 1 mg via ORAL
  Filled 2013-12-03 (×2): qty 1

## 2013-12-03 MED ORDER — ACETAMINOPHEN 325 MG PO TABS: 650.0000 mg | ORAL_TABLET | ORAL | Status: DC | PRN

## 2013-12-03 MED ORDER — ONDANSETRON HCL 4 MG PO TABS: 4.0000 mg | ORAL_TABLET | Freq: Three times a day (TID) | ORAL | Status: DC | PRN

## 2013-12-03 MED ORDER — ADULT MULTIVITAMIN W/MINERALS CH
1.0000 | ORAL_TABLET | Freq: Every day | ORAL | Status: DC
  Administered 2013-12-03 – 2013-12-04 (×2): 1 via ORAL
  Filled 2013-12-03 (×2): qty 1

## 2013-12-03 MED ORDER — LORAZEPAM 1 MG PO TABS: 0.0000 mg | ORAL_TABLET | Freq: Four times a day (QID) | ORAL | Status: DC

## 2013-12-03 MED ORDER — ALUM & MAG HYDROXIDE-SIMETH 200-200-20 MG/5ML PO SUSP: 30.0000 mL | ORAL | Status: DC | PRN

## 2013-12-03 MED ORDER — THIAMINE HCL 100 MG/ML IJ SOLN: 100.0000 mg | Freq: Every day | INTRAMUSCULAR | Status: DC

## 2013-12-03 MED ORDER — IBUPROFEN 400 MG PO TABS: 600.0000 mg | ORAL_TABLET | Freq: Three times a day (TID) | ORAL | Status: DC | PRN

## 2013-12-03 MED ORDER — VITAMIN B-1 100 MG PO TABS
100.0000 mg | ORAL_TABLET | Freq: Every day | ORAL | Status: DC
  Administered 2013-12-03 – 2013-12-04 (×2): 100 mg via ORAL
  Filled 2013-12-03 (×2): qty 1

## 2013-12-03 MED ORDER — LORAZEPAM 1 MG PO TABS: 0.0000 mg | ORAL_TABLET | Freq: Two times a day (BID) | ORAL | Status: DC

## 2013-12-03 NOTE — ED Notes (Signed)
Per pt and social worker sts that pt wants help for drinking. sts when people upset her she drinks a lot. Pt denies SI. Pt upset and depressed.

## 2013-12-03 NOTE — ED Provider Notes (Signed)
9:20 PM Patient is resting comfortably no distress. Requesting help with her alcohol problem. Last drank alcohol last night. Results for orders placed during the hospital encounter of 12/03/13  ACETAMINOPHEN LEVEL      Result Value Ref Range   Acetaminophen (Tylenol), Serum <15.0  10 - 30 ug/mL  CBC      Result Value Ref Range   WBC 5.3  4.0 - 10.5 K/uL   RBC 4.51  3.87 - 5.11 MIL/uL   Hemoglobin 13.4  12.0 - 15.0 g/dL   HCT 16.139.8  09.636.0 - 04.546.0 %   MCV 88.2  78.0 - 100.0 fL   MCH 29.7  26.0 - 34.0 pg   MCHC 33.7  30.0 - 36.0 g/dL   RDW 40.914.1  81.111.5 - 91.415.5 %   Platelets 283  150 - 400 K/uL  COMPREHENSIVE METABOLIC PANEL      Result Value Ref Range   Sodium 139  137 - 147 mEq/L   Potassium 4.2  3.7 - 5.3 mEq/L   Chloride 100  96 - 112 mEq/L   CO2 22  19 - 32 mEq/L   Glucose, Bld 126 (*) 70 - 99 mg/dL   BUN 7  6 - 23 mg/dL   Creatinine, Ser 7.820.48 (*) 0.50 - 1.10 mg/dL   Calcium 9.1  8.4 - 95.610.5 mg/dL   Total Protein 8.1  6.0 - 8.3 g/dL   Albumin 4.2  3.5 - 5.2 g/dL   AST 27  0 - 37 U/L   ALT 17  0 - 35 U/L   Alkaline Phosphatase 65  39 - 117 U/L   Total Bilirubin 0.3  0.3 - 1.2 mg/dL   GFR calc non Af Amer >90  >90 mL/min   GFR calc Af Amer >90  >90 mL/min  ETHANOL      Result Value Ref Range   Alcohol, Ethyl (B) 329 (*) 0 - 11 mg/dL  SALICYLATE LEVEL      Result Value Ref Range   Salicylate Lvl <2.0 (*) 2.8 - 20.0 mg/dL  URINE RAPID DRUG SCREEN (HOSP PERFORMED)      Result Value Ref Range   Opiates NONE DETECTED  NONE DETECTED   Cocaine NONE DETECTED  NONE DETECTED   Benzodiazepines NONE DETECTED  NONE DETECTED   Amphetamines NONE DETECTED  NONE DETECTED   Tetrahydrocannabinol NONE DETECTED  NONE DETECTED   Barbiturates NONE DETECTED  NONE DETECTED  PREGNANCY, URINE      Result Value Ref Range   Preg Test, Ur NEGATIVE  NEGATIVE  ETHANOL      Result Value Ref Range   Alcohol, Ethyl (B) 217 (*) 0 - 11 mg/dL   No results found.   Doug SouSam Jacubowitz, MD 12/03/13 2138

## 2013-12-03 NOTE — ED Notes (Signed)
PT HAS BEEN REFERRED TO [facility].

## 2013-12-03 NOTE — ED Notes (Addendum)
Pt states that her last drink of alcohol was 3 beers last night. Pt states that she does not drink every night, but when she does drink, she has a hard time stopping. Pt states that she wants to stop drinking for her children and so that "people stop laughing at her."

## 2013-12-03 NOTE — ED Notes (Signed)
telepsych cart placed in pts room.

## 2013-12-03 NOTE — ED Provider Notes (Signed)
Medical screening examination/treatment/procedure(s) were conducted as a shared visit with non-physician practitioner(s) and myself.  I personally evaluated the patient during the encounter.   EKG Interpretation None       Doug SouSam Jacubowitz, MD 12/03/13 2155

## 2013-12-03 NOTE — ED Notes (Signed)
Pt on tele conference

## 2013-12-03 NOTE — ED Notes (Signed)
PATIENT HAS COME TO THE ED FOR DETOX FROM ALCOHOL. SHE INITALLY STATES THAT SHE HASNT HAD ANY ALCOHOL TODAY. WHEN ASKED ABOUT HER ALCOHOL LEVEL SHE STATES SHE WILL TELL THE TRUTH. SHE STATES SHE HAS DRANK 2 BIG BEERS. SHE IS INSISTANT THAT SHE DOES NOT DRINK EVERY DAY. STATES ONLY 1-2 DAYS A WEEK. STATES WHEN SHE STARTS TO DRINK SHE DOESN'T STOP ALL DAY. STATES THE SOCIAL WORKER IS WITH HER TO HELP HER "PROTECT MY CHILDREN". PATIENT ADMITS TO HAVING LEGAL ISSUES PENDING IN [place]. SHE IS ALERT AND ORIENTED. SHE IS AMBULATORY WITH A STEADY GAIT. SHE IS CALM AND COOPERATIVE. SHE DENIES ANY OTHER SUBSTANCE ABUSE. STATES SHE REALLY WANTS TO QUIT DRINKING THIS TIME. PT STATES SHE STARTED DRINKING AROUND AGE 73 OR 17. SHE IS A NONSMOKER.

## 2013-12-03 NOTE — BH Assessment (Signed)
[facility] Assessment Progress Note  At 17:30 I spoke to pt's nurse, Drinda ButtsAnnette.  She reports that at this time pt is too intoxicated to be able to participate in assessment.  [facility] when pt is more sober.  Doylene Canninghomas Hughes, MA Triage Specialist 12/03/2013 @ 17:36

## 2013-12-03 NOTE — ED Provider Notes (Signed)
CSN: [id number]     Arrival date & time 12/03/13  1049 History   First MD Initiated Contact with Patient 12/03/13 1224     Chief Complaint  Patient presents with  . Alcohol Problem     (Consider location/radiation/quality/duration/timing/severity/associated sxs/prior Treatment) HPI  38 year old female with history of alcohol abuse, and depression who presents accompanied by her social worker for help with alcohol abuse. Patient states she wants to quit drinking. States she drinks often normally to help her cope with life stress including her husband who has been unfaithful to her in the past. Her last drink was last night. She did not eat anything this morning. She refills like she needs to quit drinking. She denies SI/HI/hallucination. Denies using any street drugs. She is not hurting anywhere. She has no other complaints.  Past Medical History  Diagnosis Date  . Alcohol abuse   . Depression   . Alcoholism /alcohol abuse     since age [age]yo   Past Surgical History  Procedure Laterality Date  . No past surgeries     Family History  Problem Relation Age of Onset  . Heart disease Father   . Alcohol abuse Brother    History  Substance Use Topics  . Smoking status: Never Smoker   . Smokeless tobacco: Not on file  . Alcohol Use: 36.0 oz/week    60 Cans of beer per week     Comment: Hx of heavy drinking, now states she has alcohol occasionally.   OB History   Grav Para Term Preterm Abortions TAB SAB Ect Mult Living   6 6 1       6      Review of Systems  Constitutional: Negative for fever.  Respiratory: Negative for shortness of breath.   Cardiovascular: Negative for chest pain.  Gastrointestinal: Negative for abdominal pain.  Neurological: Negative for numbness and headaches.  Psychiatric/Behavioral: Negative for agitation.  All other systems reviewed and are negative.     Allergies  Review of patient's allergies indicates no known allergies.  Home Medications    Prior to Admission medications   Medication Sig Start Date End Date Taking? Authorizing Michaelpaul Apo  OVER THE COUNTER MEDICATION Take 2 tablets by mouth 2 (two) times daily. OTC weight Loss Medication   Yes Historical Gabriele Loveland, MD   BP 119/72  Pulse 99  Temp(Src) 98.3 F (36.8 C)  Resp 19  SpO2 98% Physical Exam  Nursing note and vitals reviewed. Constitutional: She is oriented to person, place, and time. She appears well-developed and well-nourished. No distress.  HENT:  Head: Atraumatic.  Mouth/Throat: Oropharynx is clear and moist.  Eyes: Conjunctivae are normal.  Neck: Neck supple.  Cardiovascular: Normal rate and regular rhythm.   Pulmonary/Chest: Effort normal and breath sounds normal.  Abdominal: Soft. There is no tenderness.  Neurological: She is alert and oriented to person, place, and time.  Skin: No rash noted.  Psychiatric: She has a normal mood and affect.    ED Course  Procedures (including critical care time)  1:18 PM Pt here with alcohol abuse requesting for detox.  Work up initiated.    3:56 PM Pt with alcohol level of 329.  I have been monitor pt.  Placed pt on CIWA protocol, will have [facility] to evaluate pt once she is clinically sober and her alcohol level decreased below 200.    7:06 PM Pt resting comfortably.  TTS will manage pt further and decide placement for alcohol detox.    Labs Review  Labs Reviewed  COMPREHENSIVE METABOLIC PANEL - Abnormal; Notable for the following:    Glucose, Bld 126 (*)    Creatinine, Ser 0.48 (*)    All other components within normal limits  ETHANOL - Abnormal; Notable for the following:    Alcohol, Ethyl (B) 329 (*)    All other components within normal limits  SALICYLATE LEVEL - Abnormal; Notable for the following:    Salicylate Lvl <2.0 (*)    All other components within normal limits  ACETAMINOPHEN LEVEL  CBC  URINE RAPID DRUG SCREEN (HOSP PERFORMED)    Imaging Review No results found.   EKG  Interpretation None      MDM   Final diagnoses:  Alcohol abuse    BP 119/70  Pulse 92  Temp(Src) 98.3 F (36.8 C)  Resp 10  SpO2 99%     Fayrene HelperBowie Tran, PA-C 12/03/13 1906

## 2013-12-03 NOTE — ED Notes (Signed)
Spoke with sandy at [facility]. She is aware of pt updated bac. She is checking with her office about  Accepting pt since she has no social security number

## 2013-12-03 NOTE — ED Notes (Signed)
Pt aware that urine is needed. Unable to urinate at this time.  

## 2013-12-03 NOTE — ED Notes (Addendum)
Altha HarmSarah Fitzgerald, [facility] [place] [facility], working with patient requests to be contacted in the event that patient is discharged or transferred, pt verbalizes permission. Telephone is:   Office: [phone] Cell (Office): [phone] Cell (Personal): [phone]

## 2013-12-04 LAB — ETHANOL: Alcohol, Ethyl (B): 12 mg/dL — ABNORMAL HIGH (ref 0–11)
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
## 2013-12-04 NOTE — ED Notes (Signed)
Called Diane Rodriguez at [facility]. She is aware pt is enroute to her

## 2013-12-04 NOTE — ED Provider Notes (Signed)
  [facility]  Nathan R. Rubin PayorPickering, MD 12/04/13 [phone]
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
## 2013-12-04 NOTE — ED Notes (Signed)
[facility]ed [facility]. [facility] are ready for pt.

## 2013-12-04 NOTE — ED Notes (Signed)
[facility] called to transport pt [facility]

## 2013-12-04 NOTE — ED Notes (Signed)
Dr Manus Gunningrancour has been in to reeval pt

## 2014-04-12 ENCOUNTER — Encounter (HOSPITAL_COMMUNITY): Payer: Self-pay | Admitting: Emergency Medicine

## 2015-05-12 LAB — OB RESULTS CONSOLE ANTIBODY SCREEN: ANTIBODY SCREEN: NEGATIVE

## 2015-05-12 LAB — OB RESULTS CONSOLE ABO/RH: RH TYPE: POSITIVE

## 2015-05-12 LAB — OB RESULTS CONSOLE GC/CHLAMYDIA
Chlamydia: NEGATIVE
Gonorrhea: NEGATIVE

## 2015-05-12 LAB — OB RESULTS CONSOLE HIV ANTIBODY (ROUTINE TESTING): HIV: NONREACTIVE

## 2015-05-12 LAB — OB RESULTS CONSOLE HEPATITIS B SURFACE ANTIGEN: HEP B S AG: NEGATIVE

## 2015-05-12 LAB — OB RESULTS CONSOLE RUBELLA ANTIBODY, IGM: RUBELLA: IMMUNE

## 2015-05-12 LAB — OB RESULTS CONSOLE RPR: RPR: NONREACTIVE

## 2015-06-12 NOTE — L&D Delivery Note (Signed)
Delivery Note At 6:12 AM a viable female was delivered via Vaginal, Spontaneous Delivery (Presentation: ;  ).  APGAR: 9, 9; weight  .   Placenta status: Intact, Spontaneous.  Cord: 3 vessels with the following complications: None.  Cord pH: none  Anesthesia: None  Episiotomy: None Lacerations: None Suture Repair: none Est. Blood Loss (mL): 350  Mom to postpartum.  Baby to Couplet care / Skin to Skin.  HARPER,CHARLES A 10/24/2015, 6:55 AM

## 2015-06-15 ENCOUNTER — Other Ambulatory Visit (HOSPITAL_COMMUNITY): Payer: Self-pay | Admitting: Obstetrics

## 2015-06-15 DIAGNOSIS — O09529 Supervision of elderly multigravida, unspecified trimester: Secondary | ICD-10-CM

## 2015-06-22 ENCOUNTER — Ambulatory Visit (HOSPITAL_COMMUNITY)
Admission: RE | Admit: 2015-06-22 | Discharge: 2015-06-22 | Disposition: A | Discharge status: Home / Self Care | Payer: Self-pay | Source: Ambulatory Visit | Attending: Obstetrics | Admitting: Obstetrics

## 2015-06-22 ENCOUNTER — Other Ambulatory Visit (HOSPITAL_COMMUNITY): Payer: Self-pay | Admitting: Obstetrics

## 2015-06-22 ENCOUNTER — Encounter (HOSPITAL_COMMUNITY): Payer: Self-pay

## 2015-06-22 DIAGNOSIS — Z3A23 23 weeks gestation of pregnancy: Secondary | ICD-10-CM

## 2015-06-22 DIAGNOSIS — O09529 Supervision of elderly multigravida, unspecified trimester: Secondary | ICD-10-CM | POA: Insufficient documentation

## 2015-06-22 DIAGNOSIS — Z315 Encounter for genetic counseling: Secondary | ICD-10-CM | POA: Insufficient documentation

## 2015-06-22 DIAGNOSIS — Z36 Encounter for antenatal screening of mother: Secondary | ICD-10-CM | POA: Insufficient documentation

## 2015-06-22 DIAGNOSIS — Z3689 Encounter for other specified antenatal screening: Secondary | ICD-10-CM

## 2015-06-22 DIAGNOSIS — O09522 Supervision of elderly multigravida, second trimester: Secondary | ICD-10-CM | POA: Insufficient documentation

## 2015-06-22 NOTE — ED Notes (Signed)
Inquired about last alcohol use, if any with this pregnancy.  Patient adamant that she has not had any alcohol with this pregnancy.

## 2015-06-22 NOTE — Progress Notes (Signed)
Appointment Date: 06/22/2015 DOB: 07/05/1975 Referring Provider: Kathreen CosierMarshall, Bernard A, MD Attending: Dr. Particia NearingMartha Decker  Ms. Diane Rodriguez was seen for genetic counseling because of a maternal age of 40 y.o..   Although she conversed well in [place], she was offered a [place] interpreter and refused.    In summary:  Discussed maternal age and risk for fetal aneuploidy  Reviewed reduction in risk based on normal anatomy ultrasound  Discussed additional screening and diagnostic testing options  Patient declined additional testing or screening  She was counseled regarding maternal age and the association with risk for chromosome conditions due to nondisjunction with aging of the ova.   We reviewed chromosomes, nondisjunction, and the associated 1 in 2870 risk for fetal aneuploidy related to a maternal age of 40 y.o. at 7164w2d gestation.  She was counseled that the risk for aneuploidy decreases as gestational age increases, accounting for those pregnancies which spontaneously abort.  We specifically discussed Down syndrome (trisomy 1121), trisomies 4813 and 5218, and sex chromosome aneuploidies (47,XXX and 47,XXY) including the common features and prognoses of each.   We reviewed available screening options including noninvasive prenatal screening (NIPS)/cell free DNA (cfDNA) testing and detailed ultrasound.  She was counseled that screening tests are used to modify a patient's a priori risk for aneuploidy, typically based on age. This estimate provides a pregnancy specific risk assessment. We reviewed the benefits and limitations of each option. Specifically, we discussed the conditions for which each test screens, the detection rates, and false positive rates of each. She was also counseled regarding diagnostic testing via  amniocentesis. We reviewed the approximate 1 in 300-500 risk for complications from amniocentesis, including spontaneous pregnancy loss.   A complete ultrasound was performed today.  The ultrasound report will be sent under separate cover. There were no visualized fetal anomalies or markers suggestive of aneuploidy. Diagnostic testing was declined today.  She understands that screening tests cannot rule out all birth defects or genetic syndromes. The patient was advised of this limitation and states she still does not want additional testing or screening at this time.   Diane Rodriguez was provided with written information regarding sickle cell anemia (SCA) including the carrier frequency and incidence in the Hispanic population, the availability of carrier testing and prenatal diagnosis if indicated.  In addition, we discussed that hemoglobinopathies are routinely screened for as part of the [place] newborn screening panel.  She indicated that she has previously been screened, but was uncertain of the results.  Both family histories were reviewed and found to be noncontributory for birth defects, intellectual disability, and known genetic conditions. Without further information regarding the provided family history, an accurate genetic risk cannot be calculated. Further genetic counseling is warranted if more information is obtained.  Diane Rodriguez denied exposure to environmental toxins or chemical agents. She denied the use of alcohol, tobacco or street drugs. She denied significant viral illnesses during the course of her pregnancy. Her medical and surgical histories were noncontributory.   I counseled Diane Rodriguez regarding the above risks and available options.  The approximate face-to-face time with the genetic counselor was 35 minutes.  Diane Gemmaaragh Rashema Seawright, MS,  Certified Genetic Counselor

## 2015-09-13 LAB — OB RESULTS CONSOLE GBS: GBS: NEGATIVE

## 2015-10-24 ENCOUNTER — Inpatient Hospital Stay (HOSPITAL_COMMUNITY)
Admission: AD | Admit: 2015-10-24 | Discharge: 2015-10-25 | DRG: 775 | Disposition: A | Discharge status: Home / Self Care | Payer: Medicaid Other | Source: Ambulatory Visit | Attending: Obstetrics | Admitting: Obstetrics

## 2015-10-24 ENCOUNTER — Encounter (HOSPITAL_COMMUNITY): Payer: Self-pay | Admitting: *Deleted

## 2015-10-24 DIAGNOSIS — O99314 Alcohol use complicating childbirth: Principal | ICD-10-CM | POA: Diagnosis present

## 2015-10-24 DIAGNOSIS — Z23 Encounter for immunization: Secondary | ICD-10-CM | POA: Diagnosis not present

## 2015-10-24 DIAGNOSIS — Z3A41 41 weeks gestation of pregnancy: Secondary | ICD-10-CM | POA: Diagnosis not present

## 2015-10-24 LAB — CBC
HCT: 36.3 % (ref 36.0–46.0)
Hemoglobin: 12.4 g/dL (ref 12.0–15.0)
MCH: 30.5 pg (ref 26.0–34.0)
MCHC: 34.2 g/dL (ref 30.0–36.0)
MCV: 89.2 fL (ref 78.0–100.0)
PLATELETS: 213 10*3/uL (ref 150–400)
RBC: 4.07 MIL/uL (ref 3.87–5.11)
RDW: 14.5 % (ref 11.5–15.5)
WBC: 10.9 10*3/uL — ABNORMAL HIGH (ref 4.0–10.5)

## 2015-10-24 LAB — TYPE AND SCREEN
ABO/RH(D): O POS
Antibody Screen: NEGATIVE

## 2015-10-24 MED ORDER — OXYCODONE-ACETAMINOPHEN 5-325 MG PO TABS: 2.0000 | ORAL_TABLET | ORAL | Status: DC | PRN

## 2015-10-24 MED ORDER — SIMETHICONE 80 MG PO CHEW: 80.0000 mg | CHEWABLE_TABLET | ORAL | Status: DC | PRN

## 2015-10-24 MED ORDER — COCONUT OIL OIL: 1.0000 "application " | TOPICAL_OIL | Status: DC | PRN

## 2015-10-24 MED ORDER — FENTANYL CITRATE (PF) 100 MCG/2ML IJ SOLN
100.0000 ug | Freq: Once | INTRAMUSCULAR | Status: AC
  Administered 2015-10-24: 100 ug via INTRAVENOUS

## 2015-10-24 MED ORDER — MORPHINE SULFATE (PF) 0.5 MG/ML IJ SOLN
INTRAMUSCULAR | Status: AC
  Filled 2015-10-24: qty 10

## 2015-10-24 MED ORDER — BENZOCAINE-MENTHOL 20-0.5 % EX AERO: 1.0000 "application " | INHALATION_SPRAY | CUTANEOUS | Status: DC | PRN

## 2015-10-24 MED ORDER — SENNOSIDES-DOCUSATE SODIUM 8.6-50 MG PO TABS
2.0000 | ORAL_TABLET | ORAL | Status: DC
  Administered 2015-10-24: 2 via ORAL
  Filled 2015-10-24: qty 2

## 2015-10-24 MED ORDER — WITCH HAZEL-GLYCERIN EX PADS: 1.0000 "application " | MEDICATED_PAD | CUTANEOUS | Status: DC | PRN

## 2015-10-24 MED ORDER — DIBUCAINE 1 % RE OINT: 1.0000 "application " | TOPICAL_OINTMENT | RECTAL | Status: DC | PRN

## 2015-10-24 MED ORDER — DIPHENHYDRAMINE HCL 25 MG PO CAPS: 25.0000 mg | ORAL_CAPSULE | Freq: Four times a day (QID) | ORAL | Status: DC | PRN

## 2015-10-24 MED ORDER — LACTATED RINGERS IV SOLN: 500.0000 mL | INTRAVENOUS | Status: DC | PRN

## 2015-10-24 MED ORDER — ACETAMINOPHEN 325 MG PO TABS: 650.0000 mg | ORAL_TABLET | ORAL | Status: DC | PRN

## 2015-10-24 MED ORDER — OXYTOCIN 10 UNIT/ML IJ SOLN
INTRAMUSCULAR | Status: AC
  Filled 2015-10-24: qty 4

## 2015-10-24 MED ORDER — ONDANSETRON HCL 4 MG/2ML IJ SOLN
INTRAMUSCULAR | Status: AC
  Filled 2015-10-24: qty 2

## 2015-10-24 MED ORDER — ONDANSETRON HCL 4 MG PO TABS: 4.0000 mg | ORAL_TABLET | ORAL | Status: DC | PRN

## 2015-10-24 MED ORDER — LIDOCAINE HCL (PF) 1 % IJ SOLN
30.0000 mL | INTRAMUSCULAR | Status: DC | PRN
  Filled 2015-10-24: qty 30

## 2015-10-24 MED ORDER — OXYCODONE-ACETAMINOPHEN 5-325 MG PO TABS: 1.0000 | ORAL_TABLET | ORAL | Status: DC | PRN

## 2015-10-24 MED ORDER — OXYTOCIN 40 UNITS IN LACTATED RINGERS INFUSION - SIMPLE MED
2.5000 [IU]/h | INTRAVENOUS | Status: DC
  Filled 2015-10-24: qty 1000

## 2015-10-24 MED ORDER — IBUPROFEN 600 MG PO TABS
600.0000 mg | ORAL_TABLET | Freq: Four times a day (QID) | ORAL | Status: DC
  Administered 2015-10-24 – 2015-10-25 (×5): 600 mg via ORAL
  Filled 2015-10-24 (×5): qty 1

## 2015-10-24 MED ORDER — ONDANSETRON HCL 4 MG/2ML IJ SOLN
4.0000 mg | INTRAMUSCULAR | Status: DC | PRN
Start: 2015-10-24 — End: 2015-10-25

## 2015-10-24 MED ORDER — ZOLPIDEM TARTRATE 5 MG PO TABS: 5.0000 mg | ORAL_TABLET | Freq: Every evening | ORAL | Status: DC | PRN

## 2015-10-24 MED ORDER — LACTATED RINGERS IV SOLN
INTRAVENOUS | Status: DC
  Administered 2015-10-24: 05:00:00 via INTRAVENOUS

## 2015-10-24 MED ORDER — ONDANSETRON HCL 4 MG/2ML IJ SOLN: 4.0000 mg | Freq: Four times a day (QID) | INTRAMUSCULAR | Status: DC | PRN

## 2015-10-24 MED ORDER — TETANUS-DIPHTH-ACELL PERTUSSIS 5-2.5-18.5 LF-MCG/0.5 IM SUSP
0.5000 mL | Freq: Once | INTRAMUSCULAR | Status: AC
  Administered 2015-10-25: 0.5 mL via INTRAMUSCULAR
  Filled 2015-10-24: qty 0.5

## 2015-10-24 MED ORDER — OXYTOCIN BOLUS FROM INFUSION
500.0000 mL | INTRAVENOUS | Status: DC
  Administered 2015-10-24: 500 mL via INTRAVENOUS

## 2015-10-24 MED ORDER — PRENATAL MULTIVITAMIN CH
1.0000 | ORAL_TABLET | Freq: Every day | ORAL | Status: DC
  Administered 2015-10-24 – 2015-10-25 (×2): 1 via ORAL
  Filled 2015-10-24 (×2): qty 1

## 2015-10-24 MED ORDER — FLEET ENEMA 7-19 GM/118ML RE ENEM: 1.0000 | ENEMA | RECTAL | Status: DC | PRN

## 2015-10-24 MED ORDER — CEFAZOLIN SODIUM-DEXTROSE 2-4 GM/100ML-% IV SOLN
INTRAVENOUS | Status: AC
  Filled 2015-10-24: qty 100

## 2015-10-24 MED ORDER — OXYTOCIN 40 UNITS IN LACTATED RINGERS INFUSION - SIMPLE MED: 2.5000 [IU]/h | INTRAVENOUS | Status: DC | PRN

## 2015-10-24 MED ORDER — CITRIC ACID-SODIUM CITRATE 334-500 MG/5ML PO SOLN: 30.0000 mL | ORAL | Status: DC | PRN

## 2015-10-24 MED ORDER — FENTANYL CITRATE (PF) 100 MCG/2ML IJ SOLN
INTRAMUSCULAR | Status: AC
  Filled 2015-10-24: qty 2

## 2015-10-24 NOTE — H&P (Signed)
Diane Rodriguez SectionSandra Rodriguez is a 40 y.o. female presenting for UC's. Maternal Medical History:  Reason for admission: Contractions.   Fetal activity: Perceived fetal activity is normal.   Last perceived fetal movement was within the past hour.    Prenatal complications: no prenatal complications Prenatal Complications - Diabetes: none.    OB History    Gravida Para Term Preterm AB TAB SAB Ectopic Multiple Living   7 6 6  0 0 0 0 0 0 6     Past Medical History  Diagnosis Date  . Alcohol abuse   . Depression   . Alcoholism /alcohol abuse [facility](HCC)     since age [age]yo   Past Surgical History  Procedure Laterality Date  . No past surgeries     Family History: family history includes Alcohol abuse in her brother; Heart disease in her father. Social History:  reports that she has never smoked. She does not have any smokeless tobacco history on file. She reports that she drinks about 36.0 oz of alcohol per week. She reports that she does not use illicit drugs.   Prenatal Transfer Tool  Maternal Diabetes: No Genetic Screening: Declined Maternal Ultrasounds/Referrals: Normal Fetal Ultrasounds or other Referrals:  Referred to Materal Fetal Medicine  Maternal Substance Abuse:  No Significant Maternal Medications:  None Significant Maternal Lab Results:  None Other Comments:  None  Review of Systems  All other systems reviewed and are negative.     unknown if currently breastfeeding. Maternal Exam:  Uterine Assessment: Contraction strength is moderate.  Abdomen: Patient reports no abdominal tenderness. Fetal presentation: vertex  Introitus: Normal vulva. Pelvis: adequate for delivery.   Cervix: Cervix evaluated by digital exam.     Physical Exam  Nursing note and vitals reviewed. Constitutional: She is oriented to person, place, and time. She appears well-developed and well-nourished.  HENT:  Head: Normocephalic and atraumatic.  Eyes: Conjunctivae are normal. Pupils are equal,  round, and reactive to light.  Neck: Normal range of motion. Neck supple.  Cardiovascular: Normal rate and regular rhythm.   Respiratory: Effort normal and breath sounds normal.  GI: Soft.  Genitourinary: Vagina normal and uterus normal.  Musculoskeletal: Normal range of motion.  Neurological: She is alert and oriented to person, place, and time.  Skin: Skin is warm and dry.  Psychiatric: She has a normal mood and affect. Her behavior is normal. Judgment and thought content normal.    Prenatal labs: ABO, Rh:   Antibody:   Rubella:   RPR:    HBsAg:    HIV:    GBS: Negative (04/04 0000)   Assessment/Plan: 41 weeks.  G7P6.  AMA.  Active labor.  Admit.   Rasool Rommel A 10/24/2015, 4:48 AM

## 2015-10-24 NOTE — Progress Notes (Signed)
Diane Rodriguez's head measured by Dr. Erik Obeyeitnauer, pediatrician, on newborn exam and found to be 13.5 inches instead of 12 inches. This measurement to be entered in delivery summary per her request.  Also entered in newborn,s assessment flowsheet.

## 2015-10-24 NOTE — MAU Note (Signed)
PT ARRIVED - BROUGHT FROM LOBBY- UNCOMFORTABLE-     TO RM 6.    SAYS HURTING  SINCE  0100.  AND SAW BLOODY SHOW  AT 0230   NO VE IN OFFICE.

## 2015-10-25 LAB — CBC
HCT: 33.4 % — ABNORMAL LOW (ref 36.0–46.0)
HEMOGLOBIN: 11 g/dL — AB (ref 12.0–15.0)
MCH: 30.1 pg (ref 26.0–34.0)
MCHC: 32.9 g/dL (ref 30.0–36.0)
MCV: 91.5 fL (ref 78.0–100.0)
PLATELETS: 197 10*3/uL (ref 150–400)
RBC: 3.65 MIL/uL — AB (ref 3.87–5.11)
RDW: 14.9 % (ref 11.5–15.5)
WBC: 9.6 10*3/uL (ref 4.0–10.5)

## 2015-10-25 LAB — RPR: RPR Ser Ql: NONREACTIVE

## 2015-10-25 NOTE — Discharge Summary (Signed)
Obstetric Discharge Summary Reason for Admission: onset of labor Prenatal Procedures: none Intrapartum Procedures: spontaneous vaginal delivery Postpartum Procedures: none Complications-Operative and Postpartum: none HEMOGLOBIN  Date Value Ref Range Status  10/25/2015 11.0* 12.0 - 15.0 g/dL Final   HCT  Date Value Ref Range Status  10/25/2015 33.4* 36.0 - 46.0 % Final    Physical Exam:  General: alert Lochia: appropriate Uterine Fundus: firm Incision: healing well DVT Evaluation: No evidence of DVT seen on physical exam.  Discharge Diagnoses: Term Pregnancy-delivered  Discharge Information: Date: 10/25/2015 Activity: pelvic rest Diet: routine Medications: Percocet Condition: improved Instructions: refer to practice specific booklet Discharge to: home Follow-up Information    Follow up with Kathreen CosierMARSHALL,BERNARD A, MD.   Specialty:  Obstetrics and Gynecology   Contact information:   [address] [place] [address] [phone]       Newborn Data: Live born female  Birth Weight: 6 lb 12.6 oz (3080 g) APGAR: 9, 9  Home with mother.  MARSHALL,BERNARD A 10/25/2015, 6:36 AM

## 2015-10-25 NOTE — Discharge Instructions (Signed)
Discharge instructions   You can wash your hair  Shower  Eat what you want  Drink what you want  See me in 6 weeks  Your ankles are going to swell more in the next 2 weeks than when pregnant  No sex for 6 weeks   Shaterria Sager A, MD 10/25/2015

## 2015-10-26 NOTE — Clinical Social Work Maternal (Signed)
CLINICAL SOCIAL WORK MATERNAL/CHILD NOTE  Patient Details  Name: Diane Rodriguez MRN: [id number] Date of Birth: 14-Dec-1975  Date:  10/25/2015  Clinical Social Worker Initiating Note:  Camillo Quadros E. Brigitte Pulse, [place] Date/ Time Initiated:  10/25/15/1330     Child's Name:  Larene Beach   Legal Guardian:   (Parents: Buck Mam and Aida Puffer)   Need for Interpreter:  None   Date of Referral:  10/25/15     Reason for Referral:  Other (Comment) (Hx of ETOH abuse and depression)   Referral Source:  [facility]   Address:  [address] [place], [place] [address]  Phone number:  [phone]   Household Members:  Minor Children, Spouse (MOB has a 33 year old son who does not live in the home.  Couple has 5 children, ages 33, 20, 73, 76, and 15, living in the home.)   Natural Supports (not living in the home):  Friends, Social worker, Extended Family, Immediate Family   Professional Supports: None   Employment:     Type of Work:  (FOB works in Biomedical scientist.  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  MOB's facesheet notes religion as Nurse, learning disability.  Strengths:  Ability to meet basic needs , Home prepared for child , Pediatrician chosen  (Pediatric follow up will be at [facility])   Risk Factors/Current Problems:  Substance Use , Mental Health Concerns  (MOB has significant hx of ETOH abuse, but states no use during pregnancy.  MOB has hx of depression, but states no current symptoms.)   Cognitive State:  Able to Concentrate , Alert , Linear Thinking    Mood/Affect:  Euthymic , Relaxed , Interested    CSW Assessment: CSW met with MOB in her first floor room/109 to complete assessment for hx of depression and alcohol abuse.  CSW is familiar with MOB from an admission to  Antenatal/delivery of her last child in 2014/2015.  MOB was pleasant and welcoming of CSW's visit.  She declines a Administrator, sports, though CSW feels some of the conversation was lost due to the language barrier.  CSW had to clarify many times what MOB was saying. MOB states baby is doing well and that she is happy about having another baby.  She reports she has a good support system through her husband, church and friends.  She reports most of their families still live in [place] and some extended family live in [place], [place].  She reports she is in contact with her family members and that the ones who are not local are good support for her emotionally.  She states that "everyone's excited" about the baby, especially her 50 year old daughter as this is the first sister for her. MOB reports having all needed supplies for infant at home.  She states she is aware of SIDS precautions and was attentive as CSW reviewed guidelines.  She reports that baby has a crib to sleep in.  She states her husband is bringing the car seat today and that they have clothes, diapers, etc.   CSW inquired as to how MOB is feeling emotionally and referenced our last conversation 2.5 years when she reported symptoms of depression.  MOB reports feeling "so happy" and denies any current symptoms of depression.  CSW provided education regarding signs and symptoms of  perinatal mood disorders at length and stressed the importance of speaking with a medical professional if she has mental health concerns at any time.  MOB was engaged and attentive and agreed.  She states she feels comfortable with her OB provider if she symptoms arise.  She does not feel she needs counseling and or medication at this time. CSW inquired about her hx of alcohol abuse and MOB states she has not had anything to drink since prior to this pregnancy.  She states, "I just stopped."  She reports that she as tired of feeling sick and having headaches all the time from  drinking and was able to stop drinking, although she is not quite sure how other than having the realization that "this is crazy."  CSW asked if she would be concerned that the baby would test positive for alcohol if she (umbilical cord tissue) was tested.  MOB very calmly said that baby would not be positive and that she had no concerns if baby was tested.  CSW asked about CPS involvement and she admits past involvement but states no current involvement. MOB reports no needs or questions at this time.  CSW will make referral to [facility] for added support in the home. CSW reviewed case and assessment with Dr. Beckey Rutter who states she does not see features of FAS in baby.  CSW called Child Protective Services who confirmed that MOB does not have an open case and has not had her children removed from her custody.  CSW identifies no barriers to discharge.  CSW Plan/Description:  Engineer, mining , No Further Intervention Required/No Barriers to Discharge, Information/Referral to [place], [place], [place] 10/25/2015, 2:00 PM

## 2016-04-30 ENCOUNTER — Encounter (HOSPITAL_COMMUNITY): Payer: Self-pay | Admitting: *Deleted

## 2016-04-30 ENCOUNTER — Emergency Department (HOSPITAL_COMMUNITY)
Admission: EM | Admit: 2016-04-30 | Discharge: 2016-04-30 | Disposition: A | Discharge status: Home / Self Care | Payer: Medicaid Other | Attending: Emergency Medicine | Admitting: Emergency Medicine

## 2016-04-30 ENCOUNTER — Emergency Department (HOSPITAL_COMMUNITY): Payer: Medicaid Other

## 2016-04-30 DIAGNOSIS — F10920 Alcohol use, unspecified with intoxication, uncomplicated: Secondary | ICD-10-CM

## 2016-04-30 DIAGNOSIS — Z5181 Encounter for therapeutic drug level monitoring: Secondary | ICD-10-CM | POA: Insufficient documentation

## 2016-04-30 DIAGNOSIS — R930 Abnormal findings on diagnostic imaging of skull and head, not elsewhere classified: Secondary | ICD-10-CM | POA: Insufficient documentation

## 2016-04-30 DIAGNOSIS — F10120 Alcohol abuse with intoxication, uncomplicated: Secondary | ICD-10-CM | POA: Insufficient documentation

## 2016-04-30 LAB — CBC WITH DIFFERENTIAL/PLATELET
BASOS ABS: 0 10*3/uL (ref 0.0–0.1)
BASOS PCT: 0 %
EOS PCT: 1 %
Eosinophils Absolute: 0.1 10*3/uL (ref 0.0–0.7)
HCT: 42.4 % (ref 36.0–46.0)
Hemoglobin: 14.2 g/dL (ref 12.0–15.0)
Lymphocytes Relative: 56 %
Lymphs Abs: 4 10*3/uL (ref 0.7–4.0)
MCH: 31.4 pg (ref 26.0–34.0)
MCHC: 33.5 g/dL (ref 30.0–36.0)
MCV: 93.8 fL (ref 78.0–100.0)
MONO ABS: 0.4 10*3/uL (ref 0.1–1.0)
Monocytes Relative: 6 %
NEUTROS ABS: 2.6 10*3/uL (ref 1.7–7.7)
Neutrophils Relative %: 37 %
PLATELETS: 321 10*3/uL (ref 150–400)
RBC: 4.52 MIL/uL (ref 3.87–5.11)
RDW: 14.1 % (ref 11.5–15.5)
WBC: 7 10*3/uL (ref 4.0–10.5)

## 2016-04-30 LAB — I-STAT CHEM 8, ED
BUN: 11 mg/dL (ref 6–20)
CALCIUM ION: 1.11 mmol/L — AB (ref 1.15–1.40)
CHLORIDE: 116 mmol/L — AB (ref 101–111)
Creatinine, Ser: 0.8 mg/dL (ref 0.44–1.00)
GLUCOSE: 149 mg/dL — AB (ref 65–99)
HCT: 44 % (ref 36.0–46.0)
Hemoglobin: 15 g/dL (ref 12.0–15.0)
Potassium: 4.7 mmol/L (ref 3.5–5.1)
Sodium: 148 mmol/L — ABNORMAL HIGH (ref 135–145)
TCO2: 21 mmol/L (ref 0–100)

## 2016-04-30 LAB — RAPID URINE DRUG SCREEN, HOSP PERFORMED
AMPHETAMINES: NOT DETECTED
BARBITURATES: NOT DETECTED
BENZODIAZEPINES: NOT DETECTED
Cocaine: NOT DETECTED
Opiates: NOT DETECTED
Tetrahydrocannabinol: NOT DETECTED

## 2016-04-30 LAB — COMPREHENSIVE METABOLIC PANEL
ALBUMIN: 4.7 g/dL (ref 3.5–5.0)
ALT: 20 U/L (ref 14–54)
AST: 22 U/L (ref 15–41)
Alkaline Phosphatase: 63 U/L (ref 38–126)
Anion gap: 9 (ref 5–15)
BUN: 10 mg/dL (ref 6–20)
CHLORIDE: 115 mmol/L — AB (ref 101–111)
CO2: 21 mmol/L — ABNORMAL LOW (ref 22–32)
Calcium: 9.3 mg/dL (ref 8.9–10.3)
Creatinine, Ser: 0.39 mg/dL — ABNORMAL LOW (ref 0.44–1.00)
GFR calc Af Amer: >60 mL/min (ref >60)
GFR calc non Af Amer: >60 mL/min (ref >60)
GLUCOSE: 149 mg/dL — AB (ref 65–99)
POTASSIUM: 3.4 mmol/L — AB (ref 3.5–5.1)
Sodium: 145 mmol/L (ref 135–145)
Total Bilirubin: 0.2 mg/dL — ABNORMAL LOW (ref 0.3–1.2)
Total Protein: 8.7 g/dL — ABNORMAL HIGH (ref 6.5–8.1)

## 2016-04-30 LAB — ACETAMINOPHEN LEVEL

## 2016-04-30 LAB — ETHANOL: Alcohol, Ethyl (B): 421 mg/dL (ref <5)

## 2016-04-30 LAB — I-STAT BETA HCG BLOOD, ED (MC, WL, AP ONLY): I-stat hCG, quantitative: <5 m[IU]/mL (ref <5)

## 2016-04-30 LAB — SALICYLATE LEVEL: Salicylate Lvl: <7 mg/dL (ref 2.8–30.0)

## 2016-04-30 MED ORDER — SODIUM CHLORIDE 0.9 % IV BOLUS (SEPSIS)
1000.0000 mL | Freq: Once | INTRAVENOUS | Status: AC
  Administered 2016-04-30: 1000 mL via INTRAVENOUS
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
## 2016-04-30 NOTE — ED Notes (Signed)
Patient came out of room and stated that she needs to leave so she can go to work. Patient also stated that she wants to know where her 700.00 dollars(cash) in her wallet. Patient stated that she has the money when she got her and wants it back. Patient speaking and communicating in clear english and

## 2016-04-30 NOTE — ED Provider Notes (Signed)
I assumed care of this patient from Dr. Jory SimsPalumo at 0700.  Please see their note for further details of Hx, PE.  Briefly patient is a 40 y.o. female with a Alcohol Intoxication No evidence of withdrawal.  Current plan is to allow her to metabolize to freedom and reassessed.  Clinically sober. Ox3. Able to tolerate PO.   The patient is safe for discharge with strict return precautions.  Disposition: Discharge  Condition: Good  I have discussed the results, Dx and Tx plan with the patient who expressed understanding and agree(s) with the plan. Discharge instructions discussed at great length. The patient was given strict return precautions who verbalized understanding of the instructions. No further questions at time of discharge.    There are no discharge medications for this patient.   Follow Up: Kathreen CosierBernard A Marshall, MD  Schedule an appointment as soon as possible for a visit  As needed       Nira ConnPedro Eduardo Keynan Heffern, MD 04/30/16 1726

## 2016-04-30 NOTE — ED Notes (Signed)
Patient is alert and oriented x3.  She was given DC instructions and follow up visit instructions.  Patient gave verbal understanding. She was DC ambulatory under her own power to home.  V/S stable.  He was not showing any signs of distress on DC 

## 2016-04-30 NOTE — ED Notes (Signed)
Turned off bair hugger, warm blankets given
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
## 2020-11-22 ENCOUNTER — Other Ambulatory Visit (HOSPITAL_BASED_OUTPATIENT_CLINIC_OR_DEPARTMENT_OTHER): Payer: Self-pay

## 2020-11-22 ENCOUNTER — Encounter (HOSPITAL_BASED_OUTPATIENT_CLINIC_OR_DEPARTMENT_OTHER): Payer: Self-pay | Admitting: *Deleted

## 2020-11-22 ENCOUNTER — Emergency Department (HOSPITAL_BASED_OUTPATIENT_CLINIC_OR_DEPARTMENT_OTHER)
Admission: EM | Admit: 2020-11-22 | Discharge: 2020-11-22 | Disposition: A | Discharge status: Home / Self Care | Payer: Self-pay | Attending: Emergency Medicine | Admitting: Emergency Medicine

## 2020-11-22 ENCOUNTER — Other Ambulatory Visit: Payer: Self-pay

## 2020-11-22 DIAGNOSIS — L237 Allergic contact dermatitis due to plants, except food: Secondary | ICD-10-CM | POA: Insufficient documentation

## 2020-11-22 DIAGNOSIS — R21 Rash and other nonspecific skin eruption: Secondary | ICD-10-CM

## 2020-11-22 MED ORDER — DIPHENHYDRAMINE HCL 25 MG PO CAPS
25.0000 mg | ORAL_CAPSULE | Freq: Once | ORAL | Status: AC
  Administered 2020-11-22: 25 mg via ORAL
  Filled 2020-11-22: qty 1

## 2020-11-22 MED ORDER — PREDNISONE 20 MG PO TABS
60.0000 mg | ORAL_TABLET | Freq: Every day | ORAL | 0 refills | Status: AC
  Filled 2020-11-22: qty 15, 5d supply, fill #0

## 2020-11-22 MED ORDER — DIPHENHYDRAMINE HCL 25 MG PO TABS
25.0000 mg | ORAL_TABLET | Freq: Four times a day (QID) | ORAL | 0 refills | Status: AC | PRN
  Filled 2020-11-22: qty 100, 25d supply, fill #0

## 2020-11-22 MED ORDER — PREDNISONE 50 MG PO TABS
60.0000 mg | ORAL_TABLET | Freq: Once | ORAL | Status: AC
  Administered 2020-11-22: 60 mg via ORAL
  Filled 2020-11-22: qty 1

## 2020-11-22 NOTE — ED Provider Notes (Signed)
[facility] EMERGENCY DEPARTMENT Provider Note   CSN: [id number] Arrival date & time: 11/22/20  1245     History Chief Complaint  Patient presents with   Rash    Diane Rodriguez is a 45 y.o. female.  Poision ivy to the face  The history is provided by the patient.  Rash Location:  Face Facial rash location:  Face Quality: redness and swelling   Severity:  Mild Onset quality:  Gradual Duration:  1 day Timing:  Intermittent Progression:  Unchanged Context: plant contact   Relieved by:  Nothing Worsened by:  Nothing Associated symptoms: no abdominal pain, no diarrhea, no fatigue, no fever, no headaches, no hoarse voice, no induration, no joint pain, no myalgias, no nausea, no periorbital edema, no shortness of breath, no sore throat, no throat swelling, no tongue swelling, no URI, not vomiting and not wheezing       Past Medical History:  Diagnosis Date   Alcohol abuse    Alcoholism /alcohol abuse    since age [age]yo   Depression     Patient Active Problem List   Diagnosis Date Noted   Indication for care in labor or delivery 10/24/2015   NSVD (normal spontaneous vaginal delivery) 10/24/2015   [redacted] weeks gestation of pregnancy    Advanced maternal age in multigravida    Active labor at term 08/04/2013   Pregnant state, incidental 04/19/2013   Alcohol abuse, unspecified 04/18/2013   Adjustment disorder with mixed anxiety and depressed mood 04/18/2013    Past Surgical History:  Procedure Laterality Date   NO PAST SURGERIES       OB History     Gravida  7   Para  7   Term  7   Preterm  0   AB  0   Living  3      SAB  0   IAB  0   Ectopic  0   Multiple  0   Live Births  3           Family History  Problem Relation Age of Onset   Heart disease Father    Alcohol abuse Brother     Social History   Tobacco Use   Smoking status: Never   Smokeless tobacco: Never  Vaping Use   Vaping Use: Never used  Substance Use  Topics   Alcohol use: Not Currently    Alcohol/week: 60.0 standard drinks    Types: 60 Cans of beer per week    Comment: Hx of heavy drinking, now states she has alcohol occasionally.   Drug use: No    Home Medications Prior to Admission medications   Medication Sig Start Date End Date Taking? Authorizing Provider  diphenhydrAMINE (BENADRYL) 25 MG tablet Take 1 tablet (25 mg total) by mouth every 6 (six) hours as needed for up to 20 doses. 11/22/20  Yes Blayke Cordrey, DO  predniSONE (DELTASONE) 20 MG tablet Take 3 tablets (60 mg total) by mouth daily for 5 days. 11/22/20 11/27/20 Yes Lester Platas, DO    Allergies    Patient has no known allergies.  Review of Systems   Review of Systems  Constitutional:  Negative for fatigue and fever.  HENT:  Negative for hoarse voice and sore throat.   Respiratory:  Negative for shortness of breath and wheezing.   Gastrointestinal:  Negative for abdominal pain, diarrhea, nausea and vomiting.  Musculoskeletal:  Negative for arthralgias and myalgias.  Skin:  Positive for rash.  Neurological:  Negative for headaches.   Physical Exam Updated Vital Signs  ED Triage Vitals  Enc Vitals Group     BP 11/22/20 1252 130/78     Pulse Rate 11/22/20 1252 93     Resp 11/22/20 1252 18     Temp 11/22/20 1252 99.2 F (37.3 C)     Temp Source 11/22/20 1252 Oral     SpO2 11/22/20 1252 98 %     Weight 11/22/20 1254 147 lb 8 oz (66.9 kg)     Height 11/22/20 1254 5\' 5"  (1.651 m)     Head Circumference --      Peak Flow --      Pain Score 11/22/20 1254 0     Pain Loc --      Pain Edu? --      Excl. in GC? --      Physical Exam Vitals and nursing note reviewed.  Constitutional:      General: She is not in acute distress.    Appearance: She is well-developed. She is not ill-appearing.  HENT:     Head: Normocephalic and atraumatic.     Nose: Nose normal.  Eyes:     Conjunctiva/sclera: Conjunctivae normal.  Cardiovascular:     Rate and Rhythm:  Normal rate and regular rhythm.     Pulses: Normal pulses.     Heart sounds: Normal heart sounds. No murmur heard. Pulmonary:     Effort: Pulmonary effort is normal. No respiratory distress.     Breath sounds: Normal breath sounds.  Abdominal:     Palpations: Abdomen is soft.     Tenderness: There is no abdominal tenderness.  Musculoskeletal:     Cervical back: Normal range of motion and neck supple.  Skin:    General: Skin is warm and dry.     Findings: Rash present.     Comments: Erythematous type rash/hives to the left side of the face, bilateral arms  Neurological:     Mental Status: She is alert.    ED Results / Procedures / Treatments   Labs (all labs ordered are listed, but only abnormal results are displayed) Labs Reviewed - No data to display  EKG None  Radiology No results found.  Procedures Procedures   Medications Ordered in ED Medications  predniSONE (DELTASONE) tablet 60 mg (has no administration in time range)  diphenhydrAMINE (BENADRYL) capsule 25 mg (has no administration in time range)    ED Course  I have reviewed the triage vital signs and the nursing notes.  Pertinent labs & imaging results that were available during my care of the patient were reviewed by me and considered in my medical decision making (see chart for details).    MDM Rules/Calculators/A&P                          Eriel Dunckel is here with poison ivy rash.  Rash mostly to the left side of the face.  Been there since yesterday.  Has not taken anything to help.  Overall does not appear to involve her eye.  Does have rash to our arms as well.  Does not appear to have any signs of anaphylaxis.  This does appear to be a contact dermatitis.  Will prescribe prednisone and Benadryl.  Given return precautions and discharged in ED in good condition.  Final Clinical Impression(s) / ED Diagnoses Final diagnoses:  Rash  Poison ivy    Rx / DC Orders ED  Discharge Orders           Ordered    predniSONE (DELTASONE) 20 MG tablet  Daily        11/22/20 1304    diphenhydrAMINE (BENADRYL) 25 MG tablet  Every 6 hours PRN        11/22/20 1304             Elexus Barman, Madelaine Bhat, DO 11/22/20 1307

## 2020-11-22 NOTE — ED Triage Notes (Signed)
Rash on her face after exposure to poison ivy.

## 2020-11-29 ENCOUNTER — Other Ambulatory Visit (HOSPITAL_BASED_OUTPATIENT_CLINIC_OR_DEPARTMENT_OTHER): Payer: Self-pay
